# Patient Record
Sex: Female | Born: 1980
Health system: Southern US, Community
[De-identification: ages and names within clinical notes are randomized; demographics above are authoritative.]

## PROBLEM LIST (undated history)

## (undated) DIAGNOSIS — Z8041 Family history of malignant neoplasm of ovary: Secondary | ICD-10-CM

## (undated) DIAGNOSIS — D649 Anemia, unspecified: Secondary | ICD-10-CM

## (undated) DIAGNOSIS — Z1371 Encounter for nonprocreative screening for genetic disease carrier status: Secondary | ICD-10-CM

## (undated) DIAGNOSIS — K219 Gastro-esophageal reflux disease without esophagitis: Secondary | ICD-10-CM

## (undated) DIAGNOSIS — R87612 Low grade squamous intraepithelial lesion on cytologic smear of cervix (LGSIL): Secondary | ICD-10-CM

## (undated) DIAGNOSIS — Z803 Family history of malignant neoplasm of breast: Secondary | ICD-10-CM

## (undated) HISTORY — PX: TONSILLECTOMY: SUR1361

## (undated) HISTORY — DX: Family history of malignant neoplasm of breast: Z80.3

## (undated) HISTORY — DX: Family history of malignant neoplasm of ovary: Z80.41

## (undated) HISTORY — DX: Encounter for nonprocreative screening for genetic disease carrier status: Z13.71

## (undated) HISTORY — DX: Low grade squamous intraepithelial lesion on cytologic smear of cervix (LGSIL): R87.612

---

## 2004-03-25 ENCOUNTER — Inpatient Hospital Stay: Payer: Self-pay | Admitting: Obstetrics & Gynecology

## 2012-12-06 DIAGNOSIS — Z803 Family history of malignant neoplasm of breast: Secondary | ICD-10-CM

## 2012-12-06 DIAGNOSIS — Z1371 Encounter for nonprocreative screening for genetic disease carrier status: Secondary | ICD-10-CM

## 2012-12-06 HISTORY — DX: Family history of malignant neoplasm of breast: Z80.3

## 2012-12-06 HISTORY — DX: Encounter for nonprocreative screening for genetic disease carrier status: Z13.71

## 2014-12-18 ENCOUNTER — Ambulatory Visit (INDEPENDENT_AMBULATORY_CARE_PROVIDER_SITE_OTHER): Payer: 59 | Admitting: Unknown Physician Specialty

## 2014-12-18 ENCOUNTER — Encounter: Payer: Self-pay | Admitting: Unknown Physician Specialty

## 2014-12-18 VITALS — BP 137/68 | HR 103 | Temp 98.4°F | Ht 64.7 in | Wt 141.2 lb

## 2014-12-18 DIAGNOSIS — J309 Allergic rhinitis, unspecified: Secondary | ICD-10-CM

## 2014-12-18 MED ORDER — FLUTICASONE PROPIONATE 50 MCG/ACT NA SUSP: 2.0000 | Freq: Every day | NASAL | Status: DC

## 2014-12-18 NOTE — Assessment & Plan Note (Signed)
Rx for Flonase. 

## 2014-12-18 NOTE — Progress Notes (Signed)
   BP 137/68 mmHg  Pulse 103  Temp(Src) 98.4 F (36.9 C)  Ht 5' 4.7" (1.643 m)  Wt 141 lb 3.2 oz (64.048 kg)  BMI 23.73 kg/m2  SpO2 100%  LMP 12/04/2014 (Approximate)   Subjective:    Patient ID: Brittney Knox, female    DOB: 04/22/1980, 34 y.o.   MRN: [id number]  HPI: Brittney Knox is a 34 y.o. female  Chief Complaint  Patient presents with  . Establish Care   Pt here to estabilish care.  She is on no medications.    History significant for frequent sinus infections.  Gyn manages physical and health maintenance labs Relevant past medical, surgical, family and social history reviewed and updated as indicated. Interim medical history since our last visit reviewed. Allergies and medications reviewed and updated.  Review of Systems  Constitutional: Negative.   HENT: Negative.   Eyes: Negative.   Respiratory: Negative.   Cardiovascular: Negative.   Gastrointestinal: Negative.   Endocrine: Negative.   Genitourinary: Negative.   Musculoskeletal:       Some back pain.  She does yoga which helps.    Skin: Negative.   Allergic/Immunologic: Negative.        Allergic rhinitis.  Has allergies and gets a sinus infection a couple of times of years.    Neurological: Negative.   Hematological: Negative.   Psychiatric/Behavioral: Negative.     Per HPI unless specifically indicated above     Objective:    BP 137/68 mmHg  Pulse 103  Temp(Src) 98.4 F (36.9 C)  Ht 5' 4.7" (1.643 m)  Wt 141 lb 3.2 oz (64.048 kg)  BMI 23.73 kg/m2  SpO2 100%  LMP 12/04/2014 (Approximate)  Wt Readings from Last 3 Encounters:  12/18/14 141 lb 3.2 oz (64.048 kg)    Physical Exam  Constitutional: She is oriented to person, place, and time. She appears well-developed and well-nourished. No distress.  HENT:  Head: Normocephalic and atraumatic.  Eyes: Conjunctivae and lids are normal. Right eye exhibits no discharge. Left eye exhibits no discharge. No scleral icterus.  Neck: Normal range of  motion. Neck supple. No JVD present. Carotid bruit is not present.  Cardiovascular: Normal rate, regular rhythm and normal heart sounds.   Pulmonary/Chest: Effort normal and breath sounds normal.  Abdominal: Normal appearance. There is no splenomegaly or hepatomegaly.  Musculoskeletal: Normal range of motion.  Neurological: She is alert and oriented to person, place, and time.  Skin: Skin is warm, dry and intact. No rash noted. No pallor.  Psychiatric: She has a normal mood and affect. Her behavior is normal. Judgment and thought content normal.  Nursing note reviewed.   No results found for this or any previous visit.    Assessment & Plan:   Problem List Items Addressed This Visit      Unprioritized   Allergic rhinitis - Primary    Rx for Flonase          Follow up plan: Return if symptoms worsen or fail to improve.

## 2015-06-22 ENCOUNTER — Encounter: Payer: Self-pay | Admitting: Unknown Physician Specialty

## 2015-06-22 ENCOUNTER — Ambulatory Visit
Admission: RE | Admit: 2015-06-22 | Discharge: 2015-06-22 | Disposition: A | Discharge status: Home / Self Care | Payer: Self-pay | Source: Ambulatory Visit | Attending: Unknown Physician Specialty | Admitting: Unknown Physician Specialty

## 2015-06-22 ENCOUNTER — Ambulatory Visit (INDEPENDENT_AMBULATORY_CARE_PROVIDER_SITE_OTHER): Payer: 59 | Admitting: Unknown Physician Specialty

## 2015-06-22 VITALS — BP 119/74 | HR 78 | Temp 98.0°F | Ht 65.0 in | Wt 140.8 lb

## 2015-06-22 DIAGNOSIS — M545 Low back pain: Secondary | ICD-10-CM

## 2015-06-22 DIAGNOSIS — M5440 Lumbago with sciatica, unspecified side: Secondary | ICD-10-CM | POA: Insufficient documentation

## 2015-06-22 LAB — UA/M W/RFLX CULTURE, ROUTINE
BILIRUBIN UA: NEGATIVE
Glucose, UA: NEGATIVE
KETONES UA: NEGATIVE
Leukocytes, UA: NEGATIVE
NITRITE UA: NEGATIVE
Protein, UA: NEGATIVE
SPEC GRAV UA: 1.015 (ref 1.005–1.030)
Urobilinogen, Ur: 0.2 mg/dL (ref 0.2–1.0)
pH, UA: 7 (ref 5.0–7.5)

## 2015-06-22 LAB — MICROSCOPIC EXAMINATION

## 2015-06-22 MED ORDER — NAPROXEN 500 MG PO TABS: 500.0000 mg | ORAL_TABLET | Freq: Two times a day (BID) | ORAL | Status: DC

## 2015-06-22 NOTE — Progress Notes (Signed)
   BP 119/74 mmHg  Pulse 78  Temp(Src) 98 F (36.7 C)  Ht 5\' 5"  (1.651 m)  Wt 140 lb 12.8 oz (63.866 kg)  BMI 23.43 kg/m2  SpO2 100%  LMP 06/17/2015 (Exact Date)   Subjective:    Patient ID: Brittney Knox, female    DOB: 10/30/1980, 35 y.o.   MRN: [id number]  HPI: Brittney Knox is a 35 y.o. female  Chief Complaint  Patient presents with  . Back Pain    pt states she has been having pain in lower back in the middle, states it has been bothering her for a while   Pt  Having achy and dull low back for a period of months.  Pt has tried a lot of different things such as yoga, stretching, heating pad, and sitting on pillows.  States the pain is constant and worse with sitting down after even mild activity.  States she came in today as her legs were feeling weak in the last month.  No problems with bowel or bladder.  No numbness or tingling.  No fever  Mother has DDD and RA  Relevant past medical, surgical, family and social history reviewed and updated as indicated. Interim medical history since our last visit reviewed. Allergies and medications reviewed and updated.  Review of Systems  Constitutional: Negative.   HENT: Negative.   Musculoskeletal: Positive for back pain.  Psychiatric/Behavioral: Negative.     Per HPI unless specifically indicated above     Objective:    BP 119/74 mmHg  Pulse 78  Temp(Src) 98 F (36.7 C)  Ht 5\' 5"  (1.651 m)  Wt 140 lb 12.8 oz (63.866 kg)  BMI 23.43 kg/m2  SpO2 100%  LMP 06/17/2015 (Exact Date)  Wt Readings from Last 3 Encounters:  06/22/15 140 lb 12.8 oz (63.866 kg)  12/18/14 141 lb 3.2 oz (64.048 kg)    Physical Exam  Constitutional: She is oriented to person, place, and time. She appears well-developed and well-nourished. No distress.  HENT:  Head: Normocephalic and atraumatic.  Eyes: Conjunctivae and lids are normal. Right eye exhibits no discharge. Left eye exhibits no discharge. No scleral icterus.  Cardiovascular: Normal  rate.   Pulmonary/Chest: Effort normal.  Abdominal: Normal appearance. There is no splenomegaly or hepatomegaly.  Musculoskeletal:       Lumbar back: She exhibits decreased range of motion and pain. She exhibits no swelling and no edema.  SLRs and DTRs are normal.  Hurts with bending forward.  Otherwise FROM.     Neurological: She is alert and oriented to person, place, and time.  Skin: Skin is intact. No rash noted. No pallor.  Psychiatric: She has a normal mood and affect. Her behavior is normal. Judgment and thought content normal.    No results found for this or any previous visit.    Assessment & Plan:   Problem List Items Addressed This Visit    None    Visit Diagnoses    Midline low back pain, with sciatica presence unspecified    -  Primary    Relevant Medications    naproxen (NAPROSYN) 500 MG tablet    Other Relevant Orders    UA/M w/rflx Culture, Routine    DG Lumbar Spine Complete    Ambulatory referral to Physical Therapy        Follow up plan: Return in about 4 weeks (around 07/20/2015).

## 2015-06-27 ENCOUNTER — Ambulatory Visit
Admission: EM | Admit: 2015-06-27 | Discharge: 2015-06-27 | Disposition: A | Discharge status: Home / Self Care | Payer: 59 | Attending: Family Medicine | Admitting: Family Medicine

## 2015-06-27 ENCOUNTER — Other Ambulatory Visit: Payer: Self-pay

## 2015-06-27 DIAGNOSIS — R002 Palpitations: Secondary | ICD-10-CM

## 2015-06-27 DIAGNOSIS — D649 Anemia, unspecified: Secondary | ICD-10-CM

## 2015-06-27 LAB — URINALYSIS COMPLETE WITH MICROSCOPIC (ARMC ONLY)
Bilirubin Urine: NEGATIVE
Glucose, UA: NEGATIVE mg/dL
Hgb urine dipstick: NEGATIVE
Ketones, ur: NEGATIVE mg/dL
Leukocytes, UA: NEGATIVE
Nitrite: NEGATIVE
Protein, ur: NEGATIVE mg/dL
RBC / HPF: NONE SEEN RBC/hpf (ref 0–5)
Specific Gravity, Urine: 1.015 (ref 1.005–1.030)
Squamous Epithelial / LPF: NONE SEEN
pH: 8.5 — ABNORMAL HIGH (ref 5.0–8.0)

## 2015-06-27 LAB — CBC WITH DIFFERENTIAL/PLATELET
Basophils Absolute: 0.1 10*3/uL (ref 0–0.1)
Basophils Relative: 1 %
Eosinophils Absolute: 0.1 10*3/uL (ref 0–0.7)
Eosinophils Relative: 2 %
HCT: 33.2 % — ABNORMAL LOW (ref 35.0–47.0)
Hemoglobin: 10.7 g/dL — ABNORMAL LOW (ref 12.0–16.0)
Lymphocytes Relative: 23 %
Lymphs Abs: 1.3 10*3/uL (ref 1.0–3.6)
MCH: 26.9 pg (ref 26.0–34.0)
MCHC: 32.2 g/dL (ref 32.0–36.0)
MCV: 83.3 fL (ref 80.0–100.0)
Monocytes Absolute: 0.4 10*3/uL (ref 0.2–0.9)
Monocytes Relative: 8 %
Neutro Abs: 3.7 10*3/uL (ref 1.4–6.5)
Neutrophils Relative %: 66 %
Platelets: 123 10*3/uL — ABNORMAL LOW (ref 150–440)
RBC: 3.98 MIL/uL (ref 3.80–5.20)
RDW: 16.4 % — ABNORMAL HIGH (ref 11.5–14.5)
WBC: 5.6 10*3/uL (ref 3.6–11.0)

## 2015-06-27 LAB — BASIC METABOLIC PANEL
Anion gap: 7 (ref 5–15)
BUN: 9 mg/dL (ref 6–20)
CO2: 27 mmol/L (ref 22–32)
Calcium: 8.9 mg/dL (ref 8.9–10.3)
Chloride: 104 mmol/L (ref 101–111)
Creatinine, Ser: 0.53 mg/dL (ref 0.44–1.00)
GFR calc Af Amer: >60 mL/min (ref >60)
GLUCOSE: 99 mg/dL (ref 65–99)
Potassium: 3.8 mmol/L (ref 3.5–5.1)
SODIUM: 138 mmol/L (ref 135–145)

## 2015-06-27 LAB — PREGNANCY, URINE: Preg Test, Ur: NEGATIVE

## 2015-06-27 LAB — MAGNESIUM: Magnesium: 2.1 mg/dL (ref 1.7–2.4)

## 2015-06-27 LAB — TSH: TSH: 2.284 u[IU]/mL (ref 0.350–4.500)

## 2015-06-27 NOTE — ED Provider Notes (Signed)
[facility]  ____________________________________________  Time seen: Approximately 5:04 PM  I have reviewed the triage vital signs and the nursing notes.   HISTORY  Chief Complaint Tachycardia   HPI Brittney Knox is a 35 y.o. female patient present for the complaint of intermittent palpitations that has been present throughout the day today. Patient reports that onset was this morning. Denies known triggers. Patient reports that she does have a history of similar in past associated with her menstrual cycles. Patient states in the past for the last several years she may have a few episodes of the same sensation and palpitation feeling a day or two before her menstrual. Patient reports she just completed her most recent menstrual cycle this past Friday. Patient reports that she works from home, and was at work today during these episodes. Denies any associated symptoms with these episodes. Patient states other than noticing the intermittent palpitations she feels well and denies other symptoms. Denies current feelings of palpitations. Reports continues to eat and drink well.   Denies chest pain, shortness breath, chest pain with deep breath, dizziness, weakness, vision changes, extremity pain, extremity swelling, lightheaded feeling, abdominal pain, dysuria, fevers, recent sickness. Denies recent immobilizations. Denies recent surgeries. Denies frequent caffeine use.    Patient states that she occasionally has 1 caffeinated beverage per day, denies any changes. Denies cough, congestions, fevers, recent sickness. Denies any changes in stress or anxiety and denies stress regarding this being a contributing factor. Patient states that she rarely drinks alcohol. Denies any drugs. Denies smoking. No exogenous estrogen. No oral contraceptives. Denies any over the counter or prescription medications. Denies any abnormal bleeding. Patient reports she does intermittently have heavy menstrual  cycles but states this last cycle was considered normal. Denies any blood in urine or blood in stool, dark-colored stool or blood with wiping. Reports multiple female family members with hypothyroidism.  PCP: Jamesetta Orleans   Patient's last menstrual period was 06/19/2015. Patient reports husband had vasectomy 10 years ago. Denies chance of pregnancy.   History reviewed. No pertinent past medical history.  Patient Active Problem List   Diagnosis Date Noted  . Allergic rhinitis 12/18/2014  Anemia postpartum per patient. Gravida 2 para 2-youngest child 74 years old. No history of transfusions.   History reviewed. No pertinent past surgical history.  Current Outpatient Rx  Name  Route  Sig  Dispense  Refill  . none            Allergies Review of patient's allergies indicates no known allergies.  Family History  Problem Relation Age of Onset  . Arthritis Mother     RA  . Heart disease Mother   . Hypothyroidism Mother   . Heart disease Father     A fib  . Diabetes Father   . Hypothyroidism Sister     Social History Social History  Substance Use Topics  . Smoking status: Never Smoker   . Smokeless tobacco: Never Used  . Alcohol Use: No    Review of Systems Constitutional: No fever/chills Eyes: No visual changes. ENT: No sore throat. Cardiovascular: Denies chest pain. As above.  Respiratory: Denies shortness of breath. Gastrointestinal: No abdominal pain.  No nausea, no vomiting.  No diarrhea.  No constipation. Genitourinary: Negative for dysuria. Musculoskeletal: Negative for back pain. Skin: Negative for rash. Neurological: Negative for headaches, focal weakness or numbness.  10-point ROS otherwise negative.  ____________________________________________ Ceasar Mons Vitals:   06/27/15 1530 06/27/15 1625  BP: 119/67 85/62  Pulse: 93 78  Temp: 98 F (36.7 C) 98 F (36.7 C)  Resp: 16 16     Orthostatic VS for the past 24 hrs:  BP- Lying Pulse- Lying BP- Sitting Pulse-  Sitting BP- Standing at 0 minutes Pulse- Standing at 0 minutes  06/27/15 1652 106/56 mmHg 73 109/64 mmHg 71 106/69 mmHg 79      Constitutional: Alert and oriented. Well appearing and in no acute distress. Eyes: Conjunctivae are normal. PERRL. EOMI. Head: Atraumatic.  Ears: no erythema, normal TMs bilaterally.   Nose: No congestion/rhinnorhea.  Mouth/Throat: Mucous membranes are moist.  Oropharynx non-erythematous. Neck: No stridor.  No cervical spine tenderness to palpation. Hematological/Lymphatic/Immunilogical: No cervical lymphadenopathy. Cardiovascular: Normal rate, regular rhythm, and sitting and standing positions.. No murmurs rubs or gallops. Grossly normal heart sounds.  Good peripheral circulation. Respiratory: Normal respiratory effort.  No retractions. Lungs CTAB. No wheezes, rales or rhonchi. Speaks in complete sentences.  Gastrointestinal: Soft and nontender. No distention. Normal Bowel sounds. No CVA tenderness. Musculoskeletal: No lower or upper extremity tenderness nor edema.  No calf tenderness bilaterally. Bilateral pedal pulses equal and easily palpated. Steady gait.  Neurologic:  Normal speech and language. No gross focal neurologic deficits are appreciated. No gait instability. Skin:  Skin is warm, dry and intact. No rash noted. Psychiatric: Mood and affect are normal. Speech and behavior are normal.  ____________________________________________   LABS (all labs ordered are listed, but only abnormal results are displayed)  Labs Reviewed  CBC WITH DIFFERENTIAL/PLATELET - Abnormal; Notable for the following:    Hemoglobin 10.7 (*)    HCT 33.2 (*)    RDW 16.4 (*)    Platelets 123 (*)    All other components within normal limits  URINALYSIS COMPLETEWITH MICROSCOPIC ([facility] ONLY) - Abnormal; Notable for the following:    Color, Urine STRAW (*)    pH 8.5 (*)    Bacteria, UA RARE (*)    All other components within normal limits  URINE CULTURE  BASIC METABOLIC  PANEL  MAGNESIUM  PREGNANCY, URINE  TSH   ____________________________________________  EKG  ED ECG REPORT I, Renford Dills, the attending provider and Dr. Judd Gaudier, personally viewed and interpreted this ECG.   Date: 06/27/2015  EKG Time: 1529  Rate: 93  Rhythm: normal EKG, normal sinus rhythm,   Axis: normal  Intervals:RSR or QR pattern in V1 suggests right ventricular conduction delay  ST&T Change: none  ____________________________________________  RADIOLOGY  No results found.   INITIAL IMPRESSION / ASSESSMENT AND PLAN / ED COURSE  Pertinent labs & imaging results that were available during my care of the patient were reviewed by me and considered in my medical decision making (see chart for details).  Very well-appearing patient. No acute distress. Presenting for complaints of intermittent palpitations today. Denies continued being factors. Reports no other complaint complaints. Denies any chest pain, shortness breath, dizziness, weakness or other complaints. Reports continues to eat and drink well. Denies current palpitation feelings. Denies known injury in factors. Will evaluate CBC, BMP, mag, TSH and urinalysis and urine pregnancy.  Labs reviewed. Urinalysis rare bacteria however patient denies dysuria, suspect contamination, will culture urinalysis and patient aware of this. Labs reviewed. Labs unremarkable except for anemia and lower platelet count of 123. Discussed these findings with patient. Patient reports an history she was taking multivitamin with iron due to her anemia after her second child. Discussed with patient the importance of restarting vitamin with iron and to have close follow-up of this, including hemoglobin and platelet counts. Discussed  patient will call patient when TSH lab returns. Patient is asymptomatic at this time. Encouraged rest, water intake, eating healthy diet and close follow-up with her PCP. Patient states that she has a primary care  appointment on Tuesday but encourage follow-up in 2-3 days. Discussed in detail with patient if any symptoms changes, chest pain, shortness of breath, dizziness or other symptoms to be evaluated immediately at urgent care or ER.  Discussed follow up with Primary care physician this week. Discussed follow up and return parameters including no resolution or any worsening concerns. Patient verbalized understanding and agreed to plan.   ____________________________________________   FINAL CLINICAL IMPRESSION(S) / ED DIAGNOSES  Final diagnoses:  Intermittent palpitations  Anemia, unspecified anemia type     Discharge Medication List as of 06/27/2015  5:43 PM      Note: This dictation was prepared with Dragon dictation along with smaller phrase technology. Any transcriptional errors that result from this process are unintentional.       Renford DillsLindsey Ester Mabe, NP 06/27/15 1910

## 2015-06-27 NOTE — Discharge Instructions (Signed)
Take medication as prescribed. Rest. Drink plenty of fluids. Take multivitamin with iron daily.   Follow up with your primary care physician in 2-3 days. Return to Urgent care or ER for palpitations, chest pain, shortness of breath, dizziness, weakness, new or worsening concerns.    Palpitations A palpitation is the feeling that your heartbeat is irregular or is faster than normal. It may feel like your heart is fluttering or skipping a beat. Palpitations are usually not a serious problem. However, in some cases, you may need further medical evaluation. CAUSES  Palpitations can be caused by:  Smoking.  Caffeine or other stimulants, such as diet pills or energy drinks.  Alcohol.  Stress and anxiety.  Strenuous physical activity.  Fatigue.  Certain medicines.  Heart disease, especially if you have a history of irregular heart rhythms (arrhythmias), such as atrial fibrillation, atrial flutter, or supraventricular tachycardia.  An improperly working pacemaker or defibrillator. DIAGNOSIS  To find the cause of your palpitations, your health care provider will take your medical history and perform a physical exam. Your health care provider may also have you take a test called an ambulatory electrocardiogram (ECG). An ECG records your heartbeat patterns over a 24-hour period. You may also have other tests, such as:  Transthoracic echocardiogram (TTE). During echocardiography, sound waves are used to evaluate how blood flows through your heart.  Transesophageal echocardiogram (TEE).  Cardiac monitoring. This allows your health care provider to monitor your heart rate and rhythm in real time.  Holter monitor. This is a portable device that records your heartbeat and can help diagnose heart arrhythmias. It allows your health care provider to track your heart activity for several days, if needed.  Stress tests by exercise or by giving medicine that makes the heart beat faster. TREATMENT    Treatment of palpitations depends on the cause of your symptoms and can vary greatly. Most cases of palpitations do not require any treatment other than time, relaxation, and monitoring your symptoms. Other causes, such as atrial fibrillation, atrial flutter, or supraventricular tachycardia, usually require further treatment. HOME CARE INSTRUCTIONS   Avoid:  Caffeinated coffee, tea, soft drinks, diet pills, and energy drinks.  Chocolate.  Alcohol.  Stop smoking if you smoke.  Reduce your stress and anxiety. Things that can help you relax include:  A method of controlling things in your body, such as your heartbeats, with your mind (biofeedback).  Yoga.  Meditation.  Physical activity such as swimming, jogging, or walking.  Get plenty of rest and sleep. SEEK MEDICAL CARE IF:   You continue to have a fast or irregular heartbeat beyond 24 hours.  Your palpitations occur more often. SEEK IMMEDIATE MEDICAL CARE IF:  You have chest pain or shortness of breath.  You have a severe headache.  You feel dizzy or you faint. MAKE SURE YOU:  Understand these instructions.  Will watch your condition.  Will get help right away if you are not doing well or get worse.   This information is not intended to replace advice given to you by your health care provider. Make sure you discuss any questions you have with your health care provider.   Document Released: 12/21/1999 Document Revised: 12/28/2012 Document Reviewed: 02/21/2011 Elsevier Interactive Patient Education [employer] Inc.

## 2015-06-27 NOTE — ED Notes (Signed)
Patient complains of rapid heart rate, states that it comes and goes. She said it feels like its skipping a beat. It has been happening for a couple days. Denies excessive caffeine use.

## 2015-06-29 LAB — URINE CULTURE: Special Requests: NORMAL

## 2015-07-03 ENCOUNTER — Ambulatory Visit: Payer: 59 | Admitting: Unknown Physician Specialty

## 2015-07-20 ENCOUNTER — Ambulatory Visit: Payer: 59 | Admitting: Unknown Physician Specialty

## 2016-05-19 ENCOUNTER — Ambulatory Visit: Payer: Self-pay | Admitting: Obstetrics & Gynecology

## 2016-09-09 ENCOUNTER — Ambulatory Visit: Payer: Self-pay | Admitting: Unknown Physician Specialty

## 2016-09-16 ENCOUNTER — Ambulatory Visit: Payer: Self-pay | Admitting: Obstetrics & Gynecology

## 2017-01-16 ENCOUNTER — Ambulatory Visit: Payer: Self-pay | Admitting: Obstetrics and Gynecology

## 2017-03-12 ENCOUNTER — Ambulatory Visit (INDEPENDENT_AMBULATORY_CARE_PROVIDER_SITE_OTHER): Payer: 59 | Admitting: Family Medicine

## 2017-03-12 VITALS — BP 115/74 | HR 80 | Temp 97.5°F | Wt 142.9 lb

## 2017-03-12 DIAGNOSIS — R2242 Localized swelling, mass and lump, left lower limb: Secondary | ICD-10-CM

## 2017-03-12 NOTE — Progress Notes (Signed)
BP 115/74 (BP Location: Left Arm, Patient Position: Sitting)   Pulse 80   Temp (!) 97.5 F (36.4 C) (Oral)   Wt 142 lb 14.4 oz (64.8 kg)   BMI 23.78 kg/m    Subjective:    Patient ID: Brittney Knox, female    DOB: Nov 30, 1980, 37 y.o.   MRN: [id number]  HPI: Brittney Knox is a 37 y.o. female  Chief Complaint  Patient presents with  . Leg Pain    left leg inner thigh knot x 2 months - painful    Pt here for eval of a mass of left inner thigh that she first started noticing about 2 months ago. Has always had some discolored red dots in that area since she was a child, but now feeling a palpable lump underneath. No known injury, no recent illnesses, fevers, body aches, weight loss. Has not noticed any growth over the past 2 months.   Relevant past medical, surgical, family and social history reviewed and updated as indicated. Interim medical history since our last visit reviewed. Allergies and medications reviewed and updated.  Review of Systems  Per HPI unless specifically indicated above     Objective:    BP 115/74 (BP Location: Left Arm, Patient Position: Sitting)   Pulse 80   Temp (!) 97.5 F (36.4 C) (Oral)   Wt 142 lb 14.4 oz (64.8 kg)   BMI 23.78 kg/m   Wt Readings from Last 3 Encounters:  03/12/17 142 lb 14.4 oz (64.8 kg)  06/27/15 140 lb (63.5 kg)  06/22/15 140 lb 12.8 oz (63.9 kg)    Physical Exam  Constitutional: She is oriented to person, place, and time. She appears well-developed and well-nourished. No distress.  HENT:  Head: Atraumatic.  Eyes: Conjunctivae are normal. Pupils are equal, round, and reactive to light.  Neck: Neck supple.  Cardiovascular: Normal rate and normal heart sounds.  Pulmonary/Chest: Effort normal and breath sounds normal. No respiratory distress.  Musculoskeletal: Normal range of motion.  Lymphadenopathy:    She has no cervical adenopathy.  Neurological: She is alert and oriented to person, place, and time.  Skin: Skin is  warm and dry.  Smooth, 1 cm palpable mass of left medial thigh. Mild ttp.  Som color change over the area which pt states is not new  Psychiatric: She has a normal mood and affect. Her behavior is normal.  Nursing note and vitals reviewed.   Results for orders placed or performed during the hospital encounter of 06/27/15  Urine culture  Result Value Ref Range   Specimen Description URINE, CLEAN CATCH    Special Requests Normal    Culture MULTIPLE SPECIES PRESENT, SUGGEST RECOLLECTION (A)    Report Status 06/29/2015 FINAL   CBC with Differential  Result Value Ref Range   WBC 5.6 3.6 - 11.0 K/uL   RBC 3.98 3.80 - 5.20 MIL/uL   Hemoglobin 10.7 (L) 12.0 - 16.0 g/dL   HCT 40.9 (L) 81.1 - 91.4 %   MCV 83.3 80.0 - 100.0 fL   MCH 26.9 26.0 - 34.0 pg   MCHC 32.2 32.0 - 36.0 g/dL   RDW 78.2 (H) 95.6 - 21.3 %   Platelets 123 (L) 150 - 440 K/uL   Neutrophils Relative % 66 %   Neutro Abs 3.7 1.4 - 6.5 K/uL   Lymphocytes Relative 23 %   Lymphs Abs 1.3 1.0 - 3.6 K/uL   Monocytes Relative 8 %   Monocytes Absolute 0.4 0.2 - 0.9 K/uL  Eosinophils Relative 2 %   Eosinophils Absolute 0.1 0 - 0.7 K/uL   Basophils Relative 1 %   Basophils Absolute 0.1 0 - 0.1 K/uL  Basic metabolic panel  Result Value Ref Range   Sodium 138 135 - 145 mmol/L   Potassium 3.8 3.5 - 5.1 mmol/L   Chloride 104 101 - 111 mmol/L   CO2 27 22 - 32 mmol/L   Glucose, Bld 99 65 - 99 mg/dL   BUN 9 6 - 20 mg/dL   Creatinine, Ser 1.610.53 0.44 - 1.00 mg/dL   Calcium 8.9 8.9 - 09.610.3 mg/dL   GFR calc non Af Amer >60 >60 mL/min   GFR calc Af Amer >60 >60 mL/min   Anion gap 7 5 - 15  Magnesium  Result Value Ref Range   Magnesium 2.1 1.7 - 2.4 mg/dL  Urinalysis complete, with microscopic ([facility] only)  Result Value Ref Range   Color, Urine STRAW (A) YELLOW   APPearance CLEAR CLEAR   Glucose, UA NEGATIVE NEGATIVE mg/dL   Bilirubin Urine NEGATIVE NEGATIVE   Ketones, ur NEGATIVE NEGATIVE mg/dL   Specific Gravity, Urine 1.015  1.005 - 1.030   Hgb urine dipstick NEGATIVE NEGATIVE   pH 8.5 (H) 5.0 - 8.0   Protein, ur NEGATIVE NEGATIVE mg/dL   Nitrite NEGATIVE NEGATIVE   Leukocytes, UA NEGATIVE NEGATIVE   RBC / HPF NONE SEEN 0 - 5 RBC/hpf   WBC, UA 0-5 0 - 5 WBC/hpf   Bacteria, UA RARE (A) NONE SEEN   Squamous Epithelial / LPF NONE SEEN NONE SEEN   Amorphous Crystal PRESENT   Pregnancy, urine  Result Value Ref Range   Preg Test, Ur NEGATIVE NEGATIVE  TSH  Result Value Ref Range   TSH 2.284 0.350 - 4.500 uIU/mL      Assessment & Plan:   Problem List Items Addressed This Visit    None    Visit Diagnoses    Leg mass, left    -  Primary   Will obtain x-ray to determine makeup of mass, does not necessarily feel lipomatous, could be lymph node or cyst. Await results, will proceed depending   Relevant Orders   US LT LOWER EXTREM LTD SOFT TISSUE NON VASCULAR       Follow up plan: Return if symptoms worsen or fail to improve.

## 2017-03-13 ENCOUNTER — Telehealth: Payer: Self-pay | Admitting: Family Medicine

## 2017-03-13 NOTE — Telephone Encounter (Signed)
LVM for patient

## 2017-03-13 NOTE — Telephone Encounter (Signed)
Copied from CRM [id number]. Topic: Inquiry >> Mar 13, 2017  2:30 PM Maia Pettiesrtiz, Kristie S wrote: Reason for CRM: pt states she was to have US on her leg and has not heard from anyone yet. Please advise.

## 2017-03-13 NOTE — Telephone Encounter (Signed)
She should hear something next week, I will keep this message so we can check in on it throughout the week

## 2017-03-15 ENCOUNTER — Encounter: Payer: Self-pay | Admitting: Family Medicine

## 2017-03-15 NOTE — Patient Instructions (Signed)
Follow up as needed

## 2017-03-17 NOTE — Telephone Encounter (Signed)
Ultrasound is scheduled for Wednesday 03/25/2017 at 3:30 [facility] 3:[facility]

## 2017-03-18 NOTE — Telephone Encounter (Signed)
Patient notified but [place] because [place].  Explained to either [facility]. Either or could r/s her appt.

## 2017-03-23 ENCOUNTER — Ambulatory Visit (HOSPITAL_COMMUNITY): Payer: 59

## 2017-03-25 ENCOUNTER — Ambulatory Visit: Payer: 59

## 2017-06-19 ENCOUNTER — Emergency Department: Payer: 59

## 2017-06-19 ENCOUNTER — Encounter: Payer: Self-pay | Admitting: Emergency Medicine

## 2017-06-19 ENCOUNTER — Other Ambulatory Visit: Payer: Self-pay

## 2017-06-19 ENCOUNTER — Emergency Department
Admission: EM | Admit: 2017-06-19 | Discharge: 2017-06-19 | Disposition: A | Discharge status: Home / Self Care | Payer: 59 | Attending: Emergency Medicine | Admitting: Emergency Medicine

## 2017-06-19 DIAGNOSIS — M79652 Pain in left thigh: Secondary | ICD-10-CM

## 2017-06-19 NOTE — ED Triage Notes (Signed)
Patient ambulatory to triage with steady gait, without difficulty or distress noted; pt reports left inner leg pain x 3-324months; st her doctor rec u/s; denies any known injury, denies any accomp symptoms

## 2017-06-19 NOTE — ED Provider Notes (Signed)
[facility] Emergency Department Provider Note  ____________________________________________   First MD Initiated Contact with Patient 06/19/17 [phone]     (approximate)  I have reviewed the triage vital signs and the nursing notes.   HISTORY  Chief Complaint Leg Pain    HPI Brittney Knox is a 37 y.o. female with no contributory or chronic medical history who presents for evaluation of 3 to 4 months of pain in the inner part of her left thigh at one particular spot.  She has a little bit of a lump and sometimes it gets better sometimes it gets worse.  Nothing in particular seems to change the symptoms.  She has no history of trauma.  She has no history of blood clots in her legs or lungs.  The pain does not radiate.  She is not on exogenous estrogen and has not had any recent immobilizations.  She talked her primary care doctor about it who recommended that she had an ultrasound, but she says she has not had time to do so for the last few weeks (or possibly months) so she thought she would come into the emergency department tonight for an ultrasound.  He has no numbness or tingling in her extremities.  History reviewed. No pertinent past medical history.  Patient Active Problem List   Diagnosis Date Noted  . Allergic rhinitis 12/18/2014    History reviewed. No pertinent surgical history.  Prior to Admission medications   Medication Sig Start Date End Date Taking? Authorizing Provider  naproxen (NAPROSYN) 500 MG tablet Take 1 tablet (500 mg total) by mouth 2 (two) times daily with a meal. Patient not taking: Reported on 03/12/2017 06/22/15   Gabriel Cirri, NP    Allergies Patient has no known allergies.  Family History  Problem Relation Age of Onset  . Arthritis Mother        RA  . Heart disease Mother   . Hypothyroidism Mother   . Heart disease Father        A fib  . Diabetes Father   . Hypothyroidism Sister     Social History Social History    Tobacco Use  . Smoking status: Never Smoker  . Smokeless tobacco: Never Used  Substance Use Topics  . Alcohol use: No  . Drug use: No    Review of Systems Constitutional: No fever/chills Cardiovascular: Denies chest pain. Respiratory: Denies shortness of breath. Gastrointestinal: No abdominal pain.   Musculoskeletal: Pain in left thigh as described above.  Negative for neck pain.  Negative for back pain. Integumentary: Negative for rash. Neurological: Negative for headaches, focal weakness or numbness.   ____________________________________________   PHYSICAL EXAM:  VITAL SIGNS: ED Triage Vitals  Enc Vitals Group     BP 06/19/17 0145 114/69     Pulse Rate 06/19/17 0145 (!) 114     Resp 06/19/17 0145 18     Temp 06/19/17 0145 98.2 F (36.8 C)     Temp Source 06/19/17 0145 Oral     SpO2 06/19/17 0145 100 %     Weight 06/19/17 0119 63.5 kg (140 lb)     Height 06/19/17 0119 1.626 m (5\' 4" )     Head Circumference --      Peak Flow --      Pain Score 06/19/17 0119 6     Pain Loc --      Pain Edu? --      Excl. in GC? --     Constitutional: Alert  and oriented. Well appearing and in no acute distress. Nose: No congestion/rhinnorhea. Cardiovascular: Normal rate, regular rhythm. Good peripheral circulation.  Respiratory: Normal respiratory effort.  No retractions.  Gastrointestinal: Soft and nondistended Musculoskeletal: Small palpable cutaneous nodule in the middle of her left medial thigh.  Nontender, appears very slightly ecchymotic as if a bruise.  No fluctuance or induration, no evidence of abscess or infection Neurologic:  Normal speech and language. No gross focal neurologic deficits are appreciated.  Skin:  Skin is warm, dry and intact. No rash noted. Psychiatric: Mood and affect are normal. Speech and behavior are normal.  ____________________________________________   LABS (all labs ordered are listed, but only abnormal results are displayed)  Labs  Reviewed - No data to display ____________________________________________  EKG  None - EKG not ordered by ED physician ____________________________________________  RADIOLOGY   ED MD interpretation: No evidence of DVT  Official radiology report(s): [place] Venous Img Lower Unilateral Left  Result Date: 06/19/2017 CLINICAL DATA:  Left leg pain for 3 or 4 months. EXAM: LEFT LOWER EXTREMITY VENOUS DOPPLER ULTRASOUND TECHNIQUE: Gray-scale sonography with graded compression, as well as color Doppler and duplex ultrasound were performed to evaluate the lower extremity deep venous systems from the level of the common femoral vein and including the common femoral, femoral, profunda femoral, popliteal and calf veins including the posterior tibial, peroneal and gastrocnemius veins when visible. The superficial great saphenous vein was also interrogated. Spectral Doppler was utilized to evaluate flow at rest and with distal augmentation maneuvers in the common femoral, femoral and popliteal veins. COMPARISON:  None. FINDINGS: Contralateral Common Femoral Vein: Respiratory phasicity is normal and symmetric with the symptomatic side. No evidence of thrombus. Normal compressibility. Common Femoral Vein: No evidence of thrombus. Normal compressibility, respiratory phasicity and response to augmentation. Saphenofemoral Junction: No evidence of thrombus. Normal compressibility and flow on color Doppler imaging. Profunda Femoral Vein: No evidence of thrombus. Normal compressibility and flow on color Doppler imaging. Femoral Vein: No evidence of thrombus. Normal compressibility, respiratory phasicity and response to augmentation. Popliteal Vein: No evidence of thrombus. Normal compressibility, respiratory phasicity and response to augmentation. Calf Veins: No evidence of thrombus. Normal compressibility and flow on color Doppler imaging. Superficial Great Saphenous Vein: No evidence of thrombus. Normal compressibility.  Venous Reflux:  None. Other Findings:  None. IMPRESSION: No evidence of left lower extremity deep venous thrombosis. Electronically Signed   By: Rubye OaksMelanie  Ehinger M.D.   On: 06/19/2017 02:33   ____________________________________________   PROCEDURES  Critical Care performed: No   Procedure(s) performed:   Procedures   ____________________________________________   INITIAL IMPRESSION / ASSESSMENT AND PLAN / ED COURSE  As part of my medical decision making, I reviewed the following data within the electronic MEDICAL RECORD NUMBER Nursing notes reviewed and incorporated and Old chart reviewed  No evidence of deep vein thrombosis or other acute or emergent medical condition.  I advised the patient to use cold packs if it is causing more discomfort, over-the-counter pain medication, and follow-up with her primary care provider.  Of note, she left prior to receiving her written discharge instructions.     ____________________________________________  FINAL CLINICAL IMPRESSION(S) / ED DIAGNOSES  Final diagnoses:  Left thigh pain     MEDICATIONS GIVEN DURING THIS VISIT:  Medications - No data to display   ED Discharge Orders    None       Note:  This document was prepared using Dragon voice recognition software and may include unintentional dictation errors.  Loleta Rose, MD 06/19/17 [phone]

## 2017-06-19 NOTE — ED Notes (Signed)
Pt to the er for left leg pain to the inner thigh. Pt states she has not had time to go and get her ultrasound. Pt reports aching to the leg and tenderness to the inner thigh. Denies hx of blood clots.

## 2017-06-19 NOTE — Discharge Instructions (Signed)

## 2017-08-05 ENCOUNTER — Encounter: Payer: Self-pay | Admitting: Obstetrics & Gynecology

## 2017-08-05 ENCOUNTER — Other Ambulatory Visit (HOSPITAL_COMMUNITY)
Admission: RE | Admit: 2017-08-05 | Discharge: 2017-08-05 | Disposition: A | Discharge status: Home / Self Care | Payer: 59 | Source: Ambulatory Visit | Attending: Obstetrics & Gynecology | Admitting: Obstetrics & Gynecology

## 2017-08-05 ENCOUNTER — Ambulatory Visit (INDEPENDENT_AMBULATORY_CARE_PROVIDER_SITE_OTHER): Payer: 59 | Admitting: Obstetrics & Gynecology

## 2017-08-05 VITALS — BP 102/70 | Ht 64.0 in | Wt 144.0 lb

## 2017-08-05 DIAGNOSIS — N920 Excessive and frequent menstruation with regular cycle: Secondary | ICD-10-CM

## 2017-08-05 DIAGNOSIS — Z124 Encounter for screening for malignant neoplasm of cervix: Secondary | ICD-10-CM | POA: Diagnosis present

## 2017-08-05 DIAGNOSIS — Z01411 Encounter for gynecological examination (general) (routine) with abnormal findings: Secondary | ICD-10-CM | POA: Diagnosis not present

## 2017-08-05 DIAGNOSIS — Z8679 Personal history of other diseases of the circulatory system: Secondary | ICD-10-CM

## 2017-08-05 DIAGNOSIS — Z0000 Encounter for general adult medical examination without abnormal findings: Secondary | ICD-10-CM

## 2017-08-05 NOTE — Patient Instructions (Signed)
Endometrial Ablation Endometrial ablation is a procedure that destroys the thin inner layer of the lining of the uterus (endometrium). This procedure may be done:  To stop heavy periods.  To stop bleeding that is causing anemia.  To control irregular bleeding.  To treat bleeding caused by small tumors (fibroids) in the endometrium.  This procedure is often an alternative to major surgery, such as removal of the uterus and cervix (hysterectomy). As a result of this procedure:  You may not be able to have children. However, if you are premenopausal (you have not gone through menopause): ? You may still have a small chance of getting pregnant. ? You will need to use a reliable method of birth control after the procedure to prevent pregnancy.  You may stop having a menstrual period, or you may have only a small amount of bleeding during your period. Menstruation may return several years after the procedure.  Tell a health care provider about:  Any allergies you have.  All medicines you are taking, including vitamins, herbs, eye drops, creams, and over-the-counter medicines.  Any problems you or family members have had with the use of anesthetic medicines.  Any blood disorders you have.  Any surgeries you have had.  Any medical conditions you have. What are the risks? Generally, this is a safe procedure. However, problems may occur, including:  A hole (perforation) in the uterus or bowel.  Infection of the uterus, bladder, or vagina.  Bleeding.  Damage to other structures or organs.  An air bubble in the lung (air embolus).  Problems with pregnancy after the procedure.  Failure of the procedure.  Decreased ability to diagnose cancer in the endometrium.  What happens before the procedure?  You will have tests of your endometrium to make sure there are no pre-cancerous cells or cancer cells present.  You may have an ultrasound of the uterus.  You may be given  medicines to thin the endometrium.  Ask your health care provider about: ? Changing or stopping your regular medicines. This is especially important if you take diabetes medicines or blood thinners. ? Taking medicines such as aspirin and ibuprofen. These medicines can thin your blood. Do not take these medicines before your procedure if your doctor tells you not to.  Plan to have someone take you home from the hospital or clinic. What happens during the procedure?  You will lie on an exam table with your feet and legs supported as in a pelvic exam.  To lower your risk of infection: ? Your health care team will wash or sanitize their hands and put on germ-free (sterile) gloves. ? Your genital area will be washed with soap.  An IV tube will be inserted into one of your veins.  You will be given a medicine to help you relax (sedative).  A surgical instrument with a light and camera (resectoscope) will be inserted into your vagina and moved into your uterus. This allows your surgeon to see inside your uterus.  Endometrial tissue will be removed using one of the following methods: ? Radiofrequency. This method uses a radiofrequency-alternating electric current to remove the endometrium. ? Cryotherapy. This method uses extreme cold to freeze the endometrium. ? Heated-free liquid. This method uses a heated saltwater (saline) solution to remove the endometrium. ? Microwave. This method uses high-energy microwaves to heat up the endometrium and remove it. ? Thermal balloon. This method involves inserting a catheter with a balloon tip into the uterus. The balloon tip is   filled with heated fluid to remove the endometrium. The procedure may vary among health care providers and hospitals. What happens after the procedure?  Your blood pressure, heart rate, breathing rate, and blood oxygen level will be monitored until the medicines you were given have worn off.  As tissue healing occurs, you may  notice vaginal bleeding for 4-6 weeks after the procedure. You may also experience: ? Cramps. ? Thin, watery vaginal discharge that is light pink or brown in color. ? A need to urinate more frequently than usual. ? Nausea.  Do not drive for 24 hours if you were given a sedative.  Do not have sex or insert anything into your vagina until your health care provider approves. Summary  Endometrial ablation is done to treat the many causes of heavy menstrual bleeding.  The procedure may be done only after medications have been tried to control the bleeding.  Plan to have someone take you home from the hospital or clinic. This information is not intended to replace advice given to you by your health care provider. Make sure you discuss any questions you have with your health care provider. Document Released: 11/02/2003 Document Revised: 01/10/2016 Document Reviewed: 01/10/2016 Elsevier Interactive Patient Education  2017 [employer]  

## 2017-08-05 NOTE — Addendum Note (Signed)
Addended by: Nadara MustardHARRIS, Hien Cunliffe P on: 08/05/2017 03:07 PM   Modules accepted: Orders

## 2017-08-05 NOTE — Progress Notes (Signed)
HPI:      [name] is a 37 y.o. [id number] who LMP was Patient's last menstrual period was 07/22/2017., she presents today for her annual examination. The patient has no complaints today. The patient is sexually active. Her last pap: approximate date 2016 and was normal. The patient does perform self breast exams.  There is notable family history of breast or ovarian cancer in her family.  The patient has regular exercise: yes.  The patient denies current symptoms of depression.    GYN History: Contraception: vasectomy  PMHx: Past Medical History:  Diagnosis Date  . LGSIL on Pap smear of cervix    Past Surgical History:  Procedure Laterality Date  . CESAREAN SECTION     Family History  Problem Relation Age of Onset  . Arthritis Mother        RA  . Heart disease Mother   . Hypothyroidism Mother   . Heart disease Father        A fib  . Diabetes Father   . Hypothyroidism Sister   . Breast cancer Maternal Grandmother   . Diabetes Maternal Grandfather   . Ovarian cancer Paternal Grandmother   . Breast cancer Cousin   . Breast cancer Maternal Aunt    Social History   Tobacco Use  . Smoking status: Never Smoker  . Smokeless tobacco: Never Used  Substance Use Topics  . Alcohol use: No  . Drug use: No    Current Outpatient Medications:  .  naproxen (NAPROSYN) 500 MG tablet, Take 1 tablet (500 mg total) by mouth 2 (two) times daily with a meal. (Patient not taking: Reported on 03/12/2017), Disp: 20 tablet, Rfl: 1 Allergies: Patient has no known allergies.  Review of Systems  Constitutional: Negative for chills, fever and malaise/fatigue.  HENT: Negative for congestion, sinus pain and sore throat.   Eyes: Negative for blurred vision and pain.  Respiratory: Negative for cough and wheezing.   Cardiovascular: Negative for chest pain and leg swelling.  Gastrointestinal: Negative for abdominal pain, constipation, diarrhea, heartburn, nausea and vomiting.  Genitourinary:  Negative for dysuria, frequency, hematuria and urgency.  Musculoskeletal: Negative for back pain, joint pain, myalgias and neck pain.  Skin: Negative for itching and rash.  Neurological: Negative for dizziness, tremors and weakness.  Endo/Heme/Allergies: Does not bruise/bleed easily.  Psychiatric/Behavioral: Negative for depression. The patient is not nervous/anxious and does not have insomnia.     Objective: BP 102/70   Ht _0  (1.626 m)   Wt 144 lb (65.3 kg)   LMP 07/22/2017   BMI 24.72 kg/m   Filed Weights   08/05/17 1423  Weight: 144 lb (65.3 kg)   Body mass index is 24.72 kg/m. Physical Exam  Constitutional: She is oriented to person, place, and time. She appears well-developed and well-nourished. No distress.  Genitourinary: Rectum normal, vagina normal and uterus normal. Pelvic exam was performed with patient supine. There is no rash or lesion on the right labia. There is no rash or lesion on the left labia. Vagina exhibits no lesion. No bleeding in the vagina. Right adnexum does not display mass and does not display tenderness. Left adnexum does not display mass and does not display tenderness. Cervix does not exhibit motion tenderness, lesion, friability or polyp.   Uterus is mobile and midaxial. Uterus is not enlarged or exhibiting a mass.  HENT:  Head: Normocephalic and atraumatic. Head is without laceration.  Right Ear: Hearing normal.  Left Ear: Hearing normal.  Nose:  No epistaxis.  No foreign bodies.  Mouth/Throat: Uvula is midline, oropharynx is clear and moist and mucous membranes are normal.  Eyes: Pupils are equal, round, and reactive to light.  Neck: Normal range of motion. Neck supple. No thyromegaly present.  Cardiovascular: Normal rate and regular rhythm. Exam reveals no gallop and no friction rub.  No murmur heard. Pulmonary/Chest: Effort normal and breath sounds normal. No respiratory distress. She has no wheezes. Right breast exhibits no mass, no skin  change and no tenderness. Left breast exhibits no mass, no skin change and no tenderness.  Abdominal: Soft. Bowel sounds are normal. She exhibits no distension. There is no tenderness. There is no rebound.  Musculoskeletal: Normal range of motion.  Neurological: She is alert and oriented to person, place, and time. No cranial nerve deficit.  Skin: Skin is warm and dry.  Psychiatric: She has a normal mood and affect. Judgment normal.  Vitals reviewed.   Assessment:  ANNUAL EXAM 1. Annual physical exam   2. Screening for cervical cancer   3. H/O varicose veins of lower extremity   4. Menorrhagia with regular cycle    Screening Plan:            1.  Cervical Screening-  Pap smear done today  2. Breast screening- Exam annually and mammogram>40 planned   3. BRCA Neg done 2014.  May benefit from update testing. Discussed and to consider.  4. Labs managed by PCP  5. Counseling for contraception: vasectomy   6. Menorrhagia- [place] and assess Patient has abnormal uterine bleeding . She has a normal exam today, with no evidence of lesions.  Evaluation includes the following: exam, labs such as hormonal testing, and pelvic ultrasound to evaluate for any structural gynecologic abnormalities.  Patient to follow up after testing.  Treatment option for menorrhagia or menometrorrhagia discussed in great detail with the patient.  Options include hormonal therapy, IUD therapy such as Mirena, D&C, Ablation, and Hysterectomy.  The pros and cons of each option discussed with patient.  7. Lower extremity vein/ pain  Vascular surgery consult    F/U  Return in about 1 year (around 08/06/2018) for Annual.  Barnett Applebaum, MD, [facility], [facility] 08/05/2017  3:02 PM

## 2017-08-06 ENCOUNTER — Encounter: Payer: Self-pay | Admitting: Physician Assistant

## 2017-08-06 ENCOUNTER — Ambulatory Visit (INDEPENDENT_AMBULATORY_CARE_PROVIDER_SITE_OTHER): Payer: 59 | Admitting: Physician Assistant

## 2017-08-06 VITALS — BP 97/62 | HR 86 | Ht 65.5 in | Wt 144.0 lb

## 2017-08-06 DIAGNOSIS — Z862 Personal history of diseases of the blood and blood-forming organs and certain disorders involving the immune mechanism: Secondary | ICD-10-CM

## 2017-08-06 DIAGNOSIS — Z13220 Encounter for screening for lipoid disorders: Secondary | ICD-10-CM | POA: Diagnosis not present

## 2017-08-06 DIAGNOSIS — Z0000 Encounter for general adult medical examination without abnormal findings: Secondary | ICD-10-CM

## 2017-08-06 DIAGNOSIS — Z1329 Encounter for screening for other suspected endocrine disorder: Secondary | ICD-10-CM | POA: Diagnosis not present

## 2017-08-06 DIAGNOSIS — Z131 Encounter for screening for diabetes mellitus: Secondary | ICD-10-CM

## 2017-08-06 DIAGNOSIS — Z114 Encounter for screening for human immunodeficiency virus [HIV]: Secondary | ICD-10-CM

## 2017-08-06 LAB — CYTOLOGY - PAP
DIAGNOSIS: NEGATIVE
HPV: NOT DETECTED

## 2017-08-06 NOTE — Patient Instructions (Signed)

## 2017-08-06 NOTE — Progress Notes (Signed)
Subjective:    Patient ID: Brittney Knox, female    DOB: 29-Jul-1980, 37 y.o.   MRN: [id number]  Brittney Knox is a 37 y.o. female presenting on 08/06/2017 for Annual Exam (No concerns. Tdap was given [facility] [place]. Requesting record)   HPI   Presenting today for CPE, has work form to be filled out. 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 Living in [place], [place]. Living with husband and children, married 14 years this weekend. Children ages 32 and 66 - no health issues. She has no health issues or complaints today.  Menstrual cycles regular. She is not on birth control and does not desire any, husband had vasectomy.   She had a PAP and breast exam performed yesterday at her OBGYN by Dr. Tiburcio Pea.   Reports history of iron deficiency anemia, previously on iron pills.    Past Medical History:  Diagnosis Date  . LGSIL on Pap smear of cervix    Past Surgical History:  Procedure Laterality Date  . CESAREAN SECTION     Social History   Socioeconomic History  . Marital status: Married    Spouse name: Not on file  . Number of children: Not on file  . Years of education: Not on file  . Highest education level: Not on file  Occupational History  . Not on file  Social Needs  . Financial resource strain: Not on file  . Food insecurity:    Worry: Not on file    Inability: Not on file  . Transportation needs:    Medical: Not on file    Non-medical: Not on file  Tobacco Use  . Smoking status: Never Smoker  . Smokeless tobacco: Never Used  Substance and Sexual Activity  . Alcohol use: No  . Drug use: No  . Sexual activity: Yes    Birth control/protection: None  Lifestyle  . Physical activity:    Days per week: Not on file    Minutes per session: Not on file  . Stress: Not on file  Relationships  . Social connections:    Talks on phone: Not on file    Gets together: Not on file    Attends religious service: Not on file    Active member of club  or organization: Not on file    Attends meetings of clubs or organizations: Not on file    Relationship status: Not on file  . Intimate partner violence:    Fear of current or ex partner: Not on file    Emotionally abused: Not on file    Physically abused: Not on file    Forced sexual activity: Not on file  Other Topics Concern  . Not on file  Social History Narrative  . Not on file   Family History  Problem Relation Age of Onset  . Arthritis Mother        RA  . Heart disease Mother   . Hypothyroidism Mother   . Heart disease Father        A fib  . Diabetes Father   . Hypothyroidism Sister   . Breast cancer Maternal Grandmother   . Diabetes Maternal Grandfather   . Ovarian cancer Paternal Grandmother   . Breast cancer Cousin   . Breast cancer Maternal Aunt    Current Outpatient Medications on File Prior to Visit  Medication Sig  . naproxen (NAPROSYN) 500 MG tablet Take 1 tablet (500 mg total) by mouth 2 (two) times daily with a  meal. (Patient not taking: Reported on 03/12/2017)   No current facility-administered medications on file prior to visit.     Review of Systems Per HPI unless specifically indicated above     Objective:    BP 97/62   Pulse 86   Ht 5' 5.5" (1.664 m)   Wt 144 lb (65.3 kg)   LMP 07/22/2017   SpO2 98%   BMI 23.60 kg/m   Wt Readings from Last 3 Encounters:  08/06/17 144 lb (65.3 kg)  08/05/17 144 lb (65.3 kg)  06/19/17 140 lb (63.5 kg)    Physical Exam  Constitutional: She is oriented to person, place, and time. She appears well-developed and well-nourished.  HENT:  Right Ear: Tympanic membrane and external ear normal.  Left Ear: Tympanic membrane and external ear normal.  Mouth/Throat: Oropharynx is clear and moist. No oropharyngeal exudate.  Eyes: Conjunctivae are normal. Right eye exhibits no discharge. Left eye exhibits no discharge.  Neck: Neck supple.  Cardiovascular: Normal rate and regular rhythm.  Pulmonary/Chest: Effort normal  and breath sounds normal.  Abdominal: Soft. Bowel sounds are normal.  Lymphadenopathy:    She has no cervical adenopathy.  Neurological: She is alert and oriented to person, place, and time.  Skin: Skin is warm and dry.  Psychiatric: She has a normal mood and affect. Her behavior is normal.   Results for orders placed or performed during the hospital encounter of 06/27/15  Urine culture  Result Value Ref Range   Specimen Description URINE, CLEAN CATCH    Special Requests Normal    Culture MULTIPLE SPECIES PRESENT, SUGGEST RECOLLECTION (A)    Report Status 06/29/2015 FINAL   CBC with Differential  Result Value Ref Range   WBC 5.6 3.6 - 11.0 K/uL   RBC 3.98 3.80 - 5.20 MIL/uL   Hemoglobin 10.7 (L) 12.0 - 16.0 g/dL   HCT 16.133.2 (L) 09.635.0 - 04.547.0 %   MCV 83.3 80.0 - 100.0 fL   MCH 26.9 26.0 - 34.0 pg   MCHC 32.2 32.0 - 36.0 g/dL   RDW 40.916.4 (H) 81.111.5 - 91.414.5 %   Platelets 123 (L) 150 - 440 K/uL   Neutrophils Relative % 66 %   Neutro Abs 3.7 1.4 - 6.5 K/uL   Lymphocytes Relative 23 %   Lymphs Abs 1.3 1.0 - 3.6 K/uL   Monocytes Relative 8 %   Monocytes Absolute 0.4 0.2 - 0.9 K/uL   Eosinophils Relative 2 %   Eosinophils Absolute 0.1 0 - 0.7 K/uL   Basophils Relative 1 %   Basophils Absolute 0.1 0 - 0.1 K/uL  Basic metabolic panel  Result Value Ref Range   Sodium 138 135 - 145 mmol/L   Potassium 3.8 3.5 - 5.1 mmol/L   Chloride 104 101 - 111 mmol/L   CO2 27 22 - 32 mmol/L   Glucose, Bld 99 65 - 99 mg/dL   BUN 9 6 - 20 mg/dL   Creatinine, Ser 7.820.53 0.44 - 1.00 mg/dL   Calcium 8.9 8.9 - 95.610.3 mg/dL   GFR calc non Af Amer >60 >60 mL/min   GFR calc Af Amer >60 >60 mL/min   Anion gap 7 5 - 15  Magnesium  Result Value Ref Range   Magnesium 2.1 1.7 - 2.4 mg/dL  Urinalysis complete, with microscopic ([facility] only)  Result Value Ref Range   Color, Urine STRAW (A) YELLOW   APPearance CLEAR CLEAR   Glucose, UA NEGATIVE NEGATIVE mg/dL   Bilirubin Urine NEGATIVE NEGATIVE  Ketones, ur  NEGATIVE NEGATIVE mg/dL   Specific Gravity, Urine 1.015 1.005 - 1.030   Hgb urine dipstick NEGATIVE NEGATIVE   pH 8.5 (H) 5.0 - 8.0   Protein, ur NEGATIVE NEGATIVE mg/dL   Nitrite NEGATIVE NEGATIVE   Leukocytes, UA NEGATIVE NEGATIVE   RBC / HPF NONE SEEN 0 - 5 RBC/hpf   WBC, UA 0-5 0 - 5 WBC/hpf   Bacteria, UA RARE (A) NONE SEEN   Squamous Epithelial / LPF NONE SEEN NONE SEEN   Amorphous Crystal PRESENT   Pregnancy, urine  Result Value Ref Range   Preg Test, Ur NEGATIVE NEGATIVE  TSH  Result Value Ref Range   TSH 2.284 0.350 - 4.500 uIU/mL      Assessment & Plan:  1. Annual physical exam   2. Thyroid disorder screening  - TSH  3. Lipid screening  - Lipid Profile  4. History of iron deficiency anemia  - CBC with Differential - Fe+TIBC+Fer  5. Diabetes mellitus screening  - Comprehensive Metabolic Panel (CMET) - HgB A1c  6. Encounter for screening for HIV  - HIV antibody (with reflex)     Follow up plan: Return in about 1 year (around 08/07/2018) for CPE.  Osvaldo Angst, PA-C [facility] 08/06/2017, 9:02 AM

## 2017-08-07 ENCOUNTER — Other Ambulatory Visit: Payer: Self-pay | Admitting: Physician Assistant

## 2017-08-07 DIAGNOSIS — D509 Iron deficiency anemia, unspecified: Secondary | ICD-10-CM

## 2017-08-07 LAB — CBC WITH DIFFERENTIAL/PLATELET
Basophils Absolute: 0 10*3/uL (ref 0.0–0.2)
Basos: 1 %
EOS (ABSOLUTE): 0.1 10*3/uL (ref 0.0–0.4)
Eos: 2 %
Hematocrit: 32.6 % — ABNORMAL LOW (ref 34.0–46.6)
Hemoglobin: 10 g/dL — ABNORMAL LOW (ref 11.1–15.9)
Immature Grans (Abs): 0 10*3/uL (ref 0.0–0.1)
Immature Granulocytes: 0 %
Lymphocytes Absolute: 1.1 10*3/uL (ref 0.7–3.1)
Lymphs: 22 %
MCH: 25.5 pg — ABNORMAL LOW (ref 26.6–33.0)
MCHC: 30.7 g/dL — ABNORMAL LOW (ref 31.5–35.7)
MCV: 83 fL (ref 79–97)
Monocytes Absolute: 0.3 10*3/uL (ref 0.1–0.9)
Monocytes: 7 %
Neutrophils Absolute: 3.3 10*3/uL (ref 1.4–7.0)
Neutrophils: 68 %
Platelets: 173 10*3/uL (ref 150–450)
RBC: 3.92 x10E6/uL (ref 3.77–5.28)
RDW: 16.6 % — ABNORMAL HIGH (ref 12.3–15.4)
WBC: 4.8 10*3/uL (ref 3.4–10.8)

## 2017-08-07 LAB — IRON,TIBC AND FERRITIN PANEL
Ferritin: 6 ng/mL — ABNORMAL LOW (ref 15–150)
Iron Saturation: 7 % — CL (ref 15–55)
Iron: 30 ug/dL (ref 27–159)
Total Iron Binding Capacity: 426 ug/dL (ref 250–450)
UIBC: 396 ug/dL (ref 131–425)

## 2017-08-07 LAB — COMPREHENSIVE METABOLIC PANEL
ALT: 12 IU/L (ref 0–32)
AST: 16 IU/L (ref 0–40)
Albumin/Globulin Ratio: 2.1 (ref 1.2–2.2)
Albumin: 4.7 g/dL (ref 3.5–5.5)
Alkaline Phosphatase: 53 IU/L (ref 39–117)
BUN/Creatinine Ratio: 17 (ref 9–23)
BUN: 12 mg/dL (ref 6–20)
Bilirubin Total: 0.9 mg/dL (ref 0.0–1.2)
CO2: 23 mmol/L (ref 20–29)
Calcium: 9.1 mg/dL (ref 8.7–10.2)
Chloride: 97 mmol/L (ref 96–106)
Creatinine, Ser: 0.72 mg/dL (ref 0.57–1.00)
GFR calc Af Amer: 126 mL/min/{1.73_m2} (ref >59)
GFR calc non Af Amer: 110 mL/min/{1.73_m2} (ref >59)
Globulin, Total: 2.2 g/dL (ref 1.5–4.5)
Glucose: 87 mg/dL (ref 65–99)
Potassium: 4.4 mmol/L (ref 3.5–5.2)
Sodium: 137 mmol/L (ref 134–144)
Total Protein: 6.9 g/dL (ref 6.0–8.5)

## 2017-08-07 LAB — HEMOGLOBIN A1C
Est. average glucose Bld gHb Est-mCnc: 97 mg/dL
Hgb A1c MFr Bld: 5 % (ref 4.8–5.6)

## 2017-08-07 LAB — LIPID PANEL
Chol/HDL Ratio: 2.5 ratio (ref 0.0–4.4)
Cholesterol, Total: 181 mg/dL (ref 100–199)
HDL: 72 mg/dL (ref >39)
LDL Calculated: 93 mg/dL (ref 0–99)
Triglycerides: 78 mg/dL (ref 0–149)
VLDL Cholesterol Cal: 16 mg/dL (ref 5–40)

## 2017-08-07 LAB — HIV ANTIBODY (ROUTINE TESTING W REFLEX): HIV Screen 4th Generation wRfx: NONREACTIVE

## 2017-08-07 LAB — TSH: TSH: 2.71 u[IU]/mL (ref 0.450–4.500)

## 2017-08-20 ENCOUNTER — Other Ambulatory Visit: Payer: 59

## 2017-08-20 ENCOUNTER — Ambulatory Visit: Payer: 59 | Admitting: Obstetrics & Gynecology

## 2017-08-26 ENCOUNTER — Encounter: Payer: Self-pay | Admitting: Obstetrics and Gynecology

## 2017-09-21 ENCOUNTER — Telehealth: Payer: Self-pay

## 2017-09-21 NOTE — Telephone Encounter (Signed)
Pt trying to get her immunization records.  [phone]  Adv pt I could not find where we had given her any immunizations.  Suggested she try her PCP, [facility], Pediatrician.  Adv there is a website the state has but I don't know what it is - may can get that from [facility].

## 2017-10-09 ENCOUNTER — Other Ambulatory Visit: Payer: 59

## 2017-10-09 ENCOUNTER — Ambulatory Visit (INDEPENDENT_AMBULATORY_CARE_PROVIDER_SITE_OTHER): Payer: 59

## 2017-10-09 ENCOUNTER — Telehealth: Payer: Self-pay | Admitting: Unknown Physician Specialty

## 2017-10-09 DIAGNOSIS — Z23 Encounter for immunization: Secondary | ICD-10-CM

## 2017-10-09 DIAGNOSIS — Z111 Encounter for screening for respiratory tuberculosis: Secondary | ICD-10-CM

## 2017-10-09 DIAGNOSIS — Z1159 Encounter for screening for other viral diseases: Secondary | ICD-10-CM

## 2017-10-09 NOTE — Telephone Encounter (Signed)
Already in.

## 2017-10-09 NOTE — Telephone Encounter (Signed)
Copied from CRM [id number]. Topic: Quick Communication - See Telephone Encounter >> Oct 09, 2017 10:30 AM Jens Som A wrote: CRM for notification. See Telephone encounter for: 10/09/17. Patient is requesting orders for TB inj

## 2017-10-09 NOTE — Telephone Encounter (Signed)
Brittney Maxwell, do you mind putting in an order for a TB screen please?

## 2017-10-13 LAB — QUANTIFERON-TB GOLD PLUS
QUANTIFERON TB1 AG VALUE: 0.04 [IU]/mL
QUANTIFERON TB2 AG VALUE: 0.04 [IU]/mL
QUANTIFERON-TB GOLD PLUS: NEGATIVE
QuantiFERON Mitogen Value: >10 IU/mL
QuantiFERON Nil Value: 0.04 IU/mL

## 2017-10-13 LAB — HEPATITIS B SURFACE ANTIBODY, QUANTITATIVE: Hepatitis B Surf Ab Quant: 103.8 m[IU]/mL (ref >9.9)

## 2017-10-21 ENCOUNTER — Telehealth: Payer: Self-pay | Admitting: Unknown Physician Specialty

## 2017-10-21 NOTE — Telephone Encounter (Signed)
Can she come into office to obtain lab draw or see me so I can order lab draw.

## 2017-10-21 NOTE — Telephone Encounter (Signed)
If it is okay for just lab draw then that would be fine, not sure office procedures for this.  I can order when I know she is coming in.

## 2017-10-21 NOTE — Telephone Encounter (Signed)
Just clarifying, do you want her to come in for an office visit or just a lab visit?

## 2017-10-21 NOTE — Telephone Encounter (Signed)
Routing to provider. Please advise.

## 2017-10-21 NOTE — Telephone Encounter (Signed)
Copied from CRM [id number]. Topic: Quick Communication - See Telephone Encounter >> Oct 21, 2017 12:11 PM Windy Kalata, NT wrote: CRM for notification. See Telephone encounter for: 10/21/17.  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

## 2017-10-22 ENCOUNTER — Other Ambulatory Visit: Payer: 59

## 2017-10-22 DIAGNOSIS — D509 Iron deficiency anemia, unspecified: Secondary | ICD-10-CM

## 2017-10-22 DIAGNOSIS — Z111 Encounter for screening for respiratory tuberculosis: Secondary | ICD-10-CM

## 2017-10-22 NOTE — Telephone Encounter (Signed)
I placed order.  Thanks.

## 2017-10-22 NOTE — Progress Notes (Signed)
Varicella titer requested for nursing school.  Patient coming to office at 1PM today for blood draw.  Order placed.

## 2017-10-22 NOTE — Telephone Encounter (Signed)
Patient is coming in today at 1pm for chicken pox titer.  Please place lab order. Thanks

## 2017-10-23 LAB — CBC WITH DIFFERENTIAL/PLATELET
Basophils Absolute: 0 10*3/uL (ref 0.0–0.2)
Basos: 1 %
EOS (ABSOLUTE): 0.1 10*3/uL (ref 0.0–0.4)
Eos: 1 %
Hematocrit: 31.2 % — ABNORMAL LOW (ref 34.0–46.6)
Hemoglobin: 9.4 g/dL — ABNORMAL LOW (ref 11.1–15.9)
Immature Grans (Abs): 0 10*3/uL (ref 0.0–0.1)
Immature Granulocytes: 0 %
Lymphocytes Absolute: 1.4 10*3/uL (ref 0.7–3.1)
Lymphs: 27 %
MCH: 23.8 pg — ABNORMAL LOW (ref 26.6–33.0)
MCHC: 30.1 g/dL — ABNORMAL LOW (ref 31.5–35.7)
MCV: 79 fL (ref 79–97)
Monocytes Absolute: 0.4 10*3/uL (ref 0.1–0.9)
Monocytes: 9 %
Neutrophils Absolute: 3.1 10*3/uL (ref 1.4–7.0)
Neutrophils: 62 %
Platelets: 188 10*3/uL (ref 150–450)
RBC: 3.95 x10E6/uL (ref 3.77–5.28)
RDW: 15 % (ref 12.3–15.4)
WBC: 5 10*3/uL (ref 3.4–10.8)

## 2017-10-23 LAB — IRON,TIBC AND FERRITIN PANEL
Ferritin: 7 ng/mL — ABNORMAL LOW (ref 15–150)
Iron Saturation: 5 % — CL (ref 15–55)
Iron: 20 ug/dL — ABNORMAL LOW (ref 27–159)
Total Iron Binding Capacity: 427 ug/dL (ref 250–450)
UIBC: 407 ug/dL (ref 131–425)

## 2017-10-23 LAB — VARICELLA ZOSTER ABS, IGG/IGM
Varicella IgM: <0.91 index (ref 0.00–0.90)
Varicella zoster IgG: 441 index (ref >165)

## 2017-10-29 ENCOUNTER — Ambulatory Visit (INDEPENDENT_AMBULATORY_CARE_PROVIDER_SITE_OTHER): Payer: 59 | Admitting: Nurse Practitioner

## 2017-10-29 ENCOUNTER — Encounter: Payer: Self-pay | Admitting: Nurse Practitioner

## 2017-10-29 DIAGNOSIS — D500 Iron deficiency anemia secondary to blood loss (chronic): Secondary | ICD-10-CM | POA: Insufficient documentation

## 2017-10-29 DIAGNOSIS — D508 Other iron deficiency anemias: Secondary | ICD-10-CM

## 2017-10-29 NOTE — Progress Notes (Signed)
BP 106/71   Pulse 85   Wt 145 lb (65.8 kg)   SpO2 95%   BMI 23.76 kg/m    Subjective:    Patient ID: Brittney Knox, female    DOB: 19-Nov-1980, 37 y.o.   MRN: [id number]  HPI: Brittney Knox is a 37 y.o. female  Chief Complaint  Patient presents with  . Anemia    Tired    ANEMIA      Chronic, ongoing.  On review of chart has had low H/H readings since 2017, with average HGB in 10 range.  Has heavy periods, first night "it will be gushing" and "running down legs".  Uses tampons and pads.  Changes these approximately every 1-2 hours during the first 2-3 days of menstrual cycle.  Periods last for 5 days, heavy for 2-3 days.  Works from home during day, which she reports makes the heavy periods more tolerable, but sometimes has to travel for work and states that the heavy periods can be debilitating.  Is also starting nursing school in upcoming months.  Her children are 23 and 65, she reports her husband had a vasectomy and they are not having anymore children.  Denies epistaxis, bleeding gums, easy bruising, SOB, palpitations, PICA, or family h/o bleeding disorders.  Does endorse fatigue, especially during her menstrual cycle; can even "be heavy lifting my arms I am so exhausted".        Is followed by Dr. Tiburcio Pea at [facility] and was scheduled to go for uterine U/S, but missed appointment d/t her daughter having tick bite.  Encouraged her to reschedule this appointment and consider options that Dr. Tiburcio Pea discussed with her (IUD, ablation, and hysterectomy).  She would prefer not to have hysterectomy and is "afraid" of other options.  Discussed with her the effect current menorrhagia is having of her daily life and she is going to discuss further IUD or ablation with Dr. Tiburcio Pea.  She did initiate ferrous sulfate as instructed.  She reports starting this last week twice a day. Encouraged her to take this with Ascorbic Acid 500MG  to enhance absorption of iron supplement.  Relevant past  medical, surgical, family and social history reviewed and updated as indicated. Interim medical history since our last visit reviewed. Allergies and medications reviewed and updated.  Review of Systems  Constitutional: Positive for fatigue. Negative for activity change, chills and fever.  Respiratory: Negative for cough, chest tightness and shortness of breath.   Cardiovascular: Negative for chest pain and palpitations.  Gastrointestinal: Negative for abdominal distention, abdominal pain, constipation, nausea and vomiting.  Endocrine: Negative for cold intolerance, heat intolerance, polydipsia, polyphagia and polyuria.  Genitourinary: Positive for menstrual problem.  Skin: Negative for pallor.  Neurological: Negative for dizziness, weakness and headaches.  Psychiatric/Behavioral: Negative for confusion and decreased concentration.     Per HPI unless specifically indicated above     Objective:    BP 106/71   Pulse 85   Wt 145 lb (65.8 kg)   SpO2 95%   BMI 23.76 kg/m   Wt Readings from Last 3 Encounters:  10/29/17 145 lb (65.8 kg)  08/06/17 144 lb (65.3 kg)  08/05/17 144 lb (65.3 kg)    Physical Exam  Constitutional: She is oriented to person, place, and time. She appears well-developed and well-nourished.  HENT:  Head: Normocephalic and atraumatic.  Eyes: Pupils are equal, round, and reactive to light. Conjunctivae are normal.  Neck: Normal range of motion. Neck supple.  Cardiovascular: Normal rate, regular rhythm  and normal heart sounds.  Pulmonary/Chest: Effort normal and breath sounds normal.  Abdominal: Soft. Bowel sounds are normal.  Neurological: She is alert and oriented to person, place, and time.  Skin: Skin is warm and dry.  Psychiatric: She has a normal mood and affect. Her behavior is normal.    Results for orders placed or performed in visit on 10/22/17  Fe+TIBC+Fer  Result Value Ref Range   Total Iron Binding Capacity 427 250 - 450 ug/dL   UIBC 161 096 -  045 ug/dL   Iron 20 (L) 27 - 409 ug/dL   Iron Saturation 5 (LL) 15 - 55 %   Ferritin 7 (L) 15 - 150 ng/mL  CBC with Differential  Result Value Ref Range   WBC 5.0 3.4 - 10.8 x10E3/uL   RBC 3.95 3.77 - 5.28 x10E6/uL   Hemoglobin 9.4 (L) 11.1 - 15.9 g/dL   Hematocrit 81.1 (L) 91.4 - 46.6 %   MCV 79 79 - 97 fL   MCH 23.8 (L) 26.6 - 33.0 pg   MCHC 30.1 (L) 31.5 - 35.7 g/dL   RDW 78.2 95.6 - 21.3 %   Platelets 188 150 - 450 x10E3/uL   Neutrophils 62 Not Estab. %   Lymphs 27 Not Estab. %   Monocytes 9 Not Estab. %   Eos 1 Not Estab. %   Basos 1 Not Estab. %   Neutrophils Absolute 3.1 1.4 - 7.0 x10E3/uL   Lymphocytes Absolute 1.4 0.7 - 3.1 x10E3/uL   Monocytes Absolute 0.4 0.1 - 0.9 x10E3/uL   EOS (ABSOLUTE) 0.1 0.0 - 0.4 x10E3/uL   Basophils Absolute 0.0 0.0 - 0.2 x10E3/uL   Immature Granulocytes 0 Not Estab. %   Immature Grans (Abs) 0.0 0.0 - 0.1 x10E3/uL  Varicella Zoster Abs, IgG/IgM  Result Value Ref Range   Varicella zoster IgG 441 Immune >165 index   Varicella IgM <0.91 0.00 - 0.90 index      Assessment & Plan:   Problem List Items Addressed This Visit      Other   Iron deficiency anemia    Chronic, ongoing secondary to menorrhagia.  Continue Ferrous Sulfate 325MG  PO BID + Ascorbic Acid 500MG  PO BID.  Patient is to follow-up with Dr. Tiburcio Pea.  Return in two months to PCP for iron and CBC check.        Relevant Medications   ferrous sulfate 325 (65 FE) MG tablet       Follow up plan: Return in about 2 months (around 12/29/2017) for anemia.

## 2017-10-29 NOTE — Patient Instructions (Signed)
Ascorbic Acid 500MG  by mouth twice a day with iron.    Intrauterine Device Information An intrauterine device (IUD) is inserted into your uterus to prevent pregnancy. There are two types of IUDs available:  Copper IUD-This type of IUD is wrapped in copper wire and is placed inside the uterus. Copper makes the uterus and fallopian tubes produce a fluid that kills sperm. The copper IUD can stay in place for 10 years.  Hormone IUD-This type of IUD contains the hormone progestin (synthetic progesterone). The hormone thickens the cervical mucus and prevents sperm from entering the uterus. It also thins the uterine lining to prevent implantation of a fertilized egg. The hormone can weaken or kill the sperm that get into the uterus. One type of hormone IUD can stay in place for 5 years, and another type can stay in place for 3 years.  Your health care provider will make sure you are a good candidate for a contraceptive IUD. Discuss with your health care provider the possible side effects. Advantages of an intrauterine device  IUDs are highly effective, reversible, long acting, and low maintenance.  There are no estrogen-related side effects.  An IUD can be used when breastfeeding.  IUDs are not associated with weight gain.  The copper IUD works immediately after insertion.  The hormone IUD works right away if inserted within 7 days of your period starting. You will need to use a backup method of birth control for 7 days if the hormone IUD is inserted at any other time in your cycle.  The copper IUD does not interfere with your female hormones.  The hormone IUD can make heavy menstrual periods lighter and decrease cramping.  The hormone IUD can be used for 3 or 5 years.  The copper IUD can be used for 10 years. Disadvantages of an intrauterine device  The hormone IUD can be associated with irregular bleeding patterns.  The copper IUD can make your menstrual flow heavier and more  painful.  You may experience cramping and vaginal bleeding after insertion. This information is not intended to replace advice given to you by your health care provider. Make sure you discuss any questions you have with your health care provider. Document Released: 11/27/2003 Document Revised: 05/31/2015 Document Reviewed: 06/13/2012 Elsevier Interactive Patient Education  2017 Elsevier Inc.  Endometrial Ablation Endometrial ablation is a procedure that destroys the thin inner layer of the lining of the uterus (endometrium). This procedure may be done:  To stop heavy periods.  To stop bleeding that is causing anemia.  To control irregular bleeding.  To treat bleeding caused by small tumors (fibroids) in the endometrium.  This procedure is often an alternative to major surgery, such as removal of the uterus and cervix (hysterectomy). As a result of this procedure:  You may not be able to have children. However, if you are premenopausal (you have not gone through menopause): ? You may still have a small chance of getting pregnant. ? You will need to use a reliable method of birth control after the procedure to prevent pregnancy.  You may stop having a menstrual period, or you may have only a small amount of bleeding during your period. Menstruation may return several years after the procedure.  Tell a health care provider about:  Any allergies you have.  All medicines you are taking, including vitamins, herbs, eye drops, creams, and over-the-counter medicines.  Any problems you or family members have had with the use of anesthetic medicines.  Any  blood disorders you have.  Any surgeries you have had.  Any medical conditions you have. What are the risks? Generally, this is a safe procedure. However, problems may occur, including:  A hole (perforation) in the uterus or bowel.  Infection of the uterus, bladder, or vagina.  Bleeding.  Damage to other structures or  organs.  An air bubble in the lung (air embolus).  Problems with pregnancy after the procedure.  Failure of the procedure.  Decreased ability to diagnose cancer in the endometrium.  What happens before the procedure?  You will have tests of your endometrium to make sure there are no pre-cancerous cells or cancer cells present.  You may have an ultrasound of the uterus.  You may be given medicines to thin the endometrium.  Ask your health care provider about: ? Changing or stopping your regular medicines. This is especially important if you take diabetes medicines or blood thinners. ? Taking medicines such as aspirin and ibuprofen. These medicines can thin your blood. Do not take these medicines before your procedure if your doctor tells you not to.  Plan to have someone take you home from the hospital or clinic. What happens during the procedure?  You will lie on an exam table with your feet and legs supported as in a pelvic exam.  To lower your risk of infection: ? Your health care team will wash or sanitize their hands and put on germ-free (sterile) gloves. ? Your genital area will be washed with soap.  An IV tube will be inserted into one of your veins.  You will be given a medicine to help you relax (sedative).  A surgical instrument with a light and camera (resectoscope) will be inserted into your vagina and moved into your uterus. This allows your surgeon to see inside your uterus.  Endometrial tissue will be removed using one of the following methods: ? Radiofrequency. This method uses a radiofrequency-alternating electric current to remove the endometrium. ? Cryotherapy. This method uses extreme cold to freeze the endometrium. ? Heated-free liquid. This method uses a heated saltwater (saline) solution to remove the endometrium. ? Microwave. This method uses high-energy microwaves to heat up the endometrium and remove it. ? Thermal balloon. This method involves  inserting a catheter with a balloon tip into the uterus. The balloon tip is filled with heated fluid to remove the endometrium. The procedure may vary among health care providers and hospitals. What happens after the procedure?  Your blood pressure, heart rate, breathing rate, and blood oxygen level will be monitored until the medicines you were given have worn off.  As tissue healing occurs, you may notice vaginal bleeding for 4-6 weeks after the procedure. You may also experience: ? Cramps. ? Thin, watery vaginal discharge that is light pink or brown in color. ? A need to urinate more frequently than usual. ? Nausea.  Do not drive for 24 hours if you were given a sedative.  Do not have sex or insert anything into your vagina until your health care provider approves. Summary  Endometrial ablation is done to treat the many causes of heavy menstrual bleeding.  The procedure may be done only after medications have been tried to control the bleeding.  Plan to have someone take you home from the hospital or clinic. This information is not intended to replace advice given to you by your health care provider. Make sure you discuss any questions you have with your health care provider. Document Released: 11/02/2003 Document Revised:  01/10/2016 Document Reviewed: 01/10/2016 Elsevier Interactive Patient Education  2017 [employer].

## 2017-10-29 NOTE — Assessment & Plan Note (Signed)
Chronic, ongoing secondary to menorrhagia.  Continue Ferrous Sulfate 325MG  PO BID + Ascorbic Acid 500MG  PO BID.  Patient is to follow-up with Dr. Tiburcio Pea.  Return in two months to PCP for iron and CBC check.

## 2017-10-30 ENCOUNTER — Ambulatory Visit: Payer: Self-pay | Admitting: Nurse Practitioner

## 2017-11-03 ENCOUNTER — Other Ambulatory Visit: Payer: Self-pay | Admitting: Obstetrics & Gynecology

## 2017-11-03 DIAGNOSIS — N921 Excessive and frequent menstruation with irregular cycle: Secondary | ICD-10-CM

## 2017-11-06 ENCOUNTER — Encounter: Payer: Self-pay | Admitting: Obstetrics & Gynecology

## 2017-11-06 ENCOUNTER — Telehealth: Payer: Self-pay | Admitting: Obstetrics & Gynecology

## 2017-11-06 ENCOUNTER — Ambulatory Visit (INDEPENDENT_AMBULATORY_CARE_PROVIDER_SITE_OTHER): Payer: 59

## 2017-11-06 ENCOUNTER — Ambulatory Visit (INDEPENDENT_AMBULATORY_CARE_PROVIDER_SITE_OTHER): Payer: 59 | Admitting: Obstetrics & Gynecology

## 2017-11-06 VITALS — BP 100/60 | Wt 146.0 lb

## 2017-11-06 DIAGNOSIS — N920 Excessive and frequent menstruation with regular cycle: Secondary | ICD-10-CM

## 2017-11-06 DIAGNOSIS — N83291 Other ovarian cyst, right side: Secondary | ICD-10-CM | POA: Diagnosis not present

## 2017-11-06 DIAGNOSIS — N921 Excessive and frequent menstruation with irregular cycle: Secondary | ICD-10-CM

## 2017-11-06 DIAGNOSIS — D500 Iron deficiency anemia secondary to blood loss (chronic): Secondary | ICD-10-CM | POA: Diagnosis not present

## 2017-11-06 NOTE — Telephone Encounter (Signed)
-----   Message from Nadara Mustard, MD sent at 11/06/2017 11:09 AM EDT ----- Regarding: In Office Ablation Surgery Booking Request Patient Full Name:  Brittney Knox  MRN: [id number]  DOB: [birth year]  Surgeon: Letitia Libra, MD  Requested Surgery Date and Time: Jan 13, 2018 Primary Diagnosis AND Code: Menorrhagia, Anemia Secondary Diagnosis and Code:  Surgical Procedure: IN OFFICE HYSTEROSCOPY w ABLATION L&D Notification: No Admission Status: OFFICE Length of Surgery: 30 min Special Case Needs: [place] H&P: Dec 26 (date)

## 2017-11-06 NOTE — Telephone Encounter (Signed)
Patient is aware of followup/preop appointment on 12/31/17 @ 4:10pm w/ Dr Tiburcio Pea, and In-office Hysteroscopy w/ Ablation on 01/13/18 @ 8:30am. Patient confirmed she will have the same [facility] policy next year. Patient is aware of cost of procedure, and insurance info was discussed. Ext given.

## 2017-11-06 NOTE — Progress Notes (Signed)
HPI: Pt has heavy periods with worsening anemia over recent mos.  Periods 6 days, w 2 days heavy.  Weakness fatigue as sx's.  On iron therapy.  Desires treatment of periods, does not plan fertility, does not desire hysterectomy.  Ultrasound demonstrates no masses seen These findings are Pelvis normal  PMHx: She  has a past medical history of BRCA negative (12/2012), Family history of breast cancer (12/2012), Family history of ovarian cancer, and LGSIL on Pap smear of cervix. Also,  has a past surgical history that includes Cesarean section., family history includes Arthritis in her mother; Breast cancer in her cousin and maternal aunt; Breast cancer (age of onset: 87) in her maternal grandmother; Diabetes in her father and maternal grandfather; Heart disease in her father and mother; Hypothyroidism in her mother and sister; Ovarian cancer (age of onset: 71) in her paternal grandmother.,  reports that she has never smoked. She has never used smokeless tobacco. She reports that she does not drink alcohol or use drugs.  She has a current medication list which includes the following prescription(s): ferrous sulfate and naproxen. Also, has No Known Allergies.  Review of Systems  All other systems reviewed and are negative.  Objective: BP 100/60   Wt 146 lb (66.2 kg)   LMP 10/18/2017   BMI 23.93 kg/m   Physical examination Constitutional NAD, Conversant  Skin No rashes, lesions or ulceration.   Extremities: Moves all appropriately.  Normal ROM for age. No lymphadenopathy.  Neuro: Grossly intact  Psych: Oriented to PPT.  Normal mood. Normal affect.   US Pelvis Transvanginal Non-ob (tv Only)  Result Date: 11/06/2017 Patient Name: Brittney Knox DOB: 1980/11/07 MRN: [id number] ULTRASOUND REPORT Location: [facility] Date of Service: 11/06/2017 Indications:AUB Findings: The uterus is retroverted and measures 8.6 x 6.3 x 5.1cm. Echo texture is heterogenous without evidence of focal masses. The  Endometrium measures 14.5 mm. Right Ovary measures 4.2 x 3.6 x 3.0 cm with complex cyst measuring 2.4 x 2.0 x 2.2cm. Left Ovary measures 3.1 x 2.1 x 2.2 cm. It is normal in appearance. Survey of the adnexa demonstrates no adnexal masses. There is small amount of free fluid near right ovary. Impression: 1. Heterogeneous uterus with no obvious masses. 2. Complex right ovarian cyst measuring 2.4cm. Recommendations: 1.Clinical correlation with the patient's History and Physical Exam. Vita Barley, RDMS RVT Review of ULTRASOUND.    I have personally reviewed images and report of recent ultrasound done at [facility].    Plan of management to be discussed with patient. Barnett Applebaum, MD, [facility], [facility] 11/06/2017  10:58 AM    Assessment:  Menorrhagia with regular cycle  Iron deficiency anemia due to chronic blood loss  Patient was told that it is normal to have menstrual bleeding after an endometrial ablation, only about 80% of patients become amenorrheic, 10% of patients have normal or light periods, and 10% of patients have no change in their bleeding pattern and may need further intervention.  She was told she will observe her periods for a few months after her ablation to see what her periods will be like; it is recommended to wait until at least three months after the procedure before making conclusions about how periods are going to be like after an ablation.  Desires ablation in Jan, over alternatives as discussed today (IUD, Depo, Hysterectomy, other).  OCP until then to control periods, as she has busy nursing school schedule. (samples LoLoEstrin gv)  A total of 15  minutes were spent face-to-face with the patient during this encounter and over half of that time dealt with counseling and coordination of care.  Barnett Applebaum, MD, [facility], [facility] 11/06/2017  11:11 AM

## 2017-11-06 NOTE — Patient Instructions (Addendum)
Endometrial Ablation (plan Jan 8) Endometrial ablation is a procedure that destroys the thin inner layer of the lining of the uterus (endometrium). This procedure may be done:  To stop heavy periods.  To stop bleeding that is causing anemia.  To control irregular bleeding.  To treat bleeding caused by small tumors (fibroids) in the endometrium.  This procedure is often an alternative to major surgery, such as removal of the uterus and cervix (hysterectomy). As a result of this procedure:  You may not be able to have children. However, if you are premenopausal (you have not gone through menopause): ? You may still have a small chance of getting pregnant. ? You will need to use a reliable method of birth control after the procedure to prevent pregnancy.  You may stop having a menstrual period, or you may have only a small amount of bleeding during your period. Menstruation may return several years after the procedure.  Tell a health care provider about:  Any allergies you have.  All medicines you are taking, including vitamins, herbs, eye drops, creams, and over-the-counter medicines.  Any problems you or family members have had with the use of anesthetic medicines.  Any blood disorders you have.  Any surgeries you have had.  Any medical conditions you have. What are the risks? Generally, this is a safe procedure. However, problems may occur, including:  A hole (perforation) in the uterus or bowel.  Infection of the uterus, bladder, or vagina.  Bleeding.  Damage to other structures or organs.  An air bubble in the lung (air embolus).  Problems with pregnancy after the procedure.  Failure of the procedure.  Decreased ability to diagnose cancer in the endometrium.  What happens before the procedure?  You will have tests of your endometrium to make sure there are no pre-cancerous cells or cancer cells present.  You may have an ultrasound of the uterus.  You may be  given medicines to thin the endometrium.  Ask your health care provider about: ? Changing or stopping your regular medicines. This is especially important if you take diabetes medicines or blood thinners. ? Taking medicines such as aspirin and ibuprofen. These medicines can thin your blood. Do not take these medicines before your procedure if your doctor tells you not to.  Plan to have someone take you home from the hospital or clinic. What happens during the procedure?  You will lie on an exam table with your feet and legs supported as in a pelvic exam.  To lower your risk of infection: ? Your health care team will wash or sanitize their hands and put on germ-free (sterile) gloves. ? Your genital area will be washed with soap.  An IV tube will be inserted into one of your veins.  You will be given a medicine to help you relax (sedative).  A surgical instrument with a light and camera (resectoscope) will be inserted into your vagina and moved into your uterus. This allows your surgeon to see inside your uterus.  Endometrial tissue will be removed using one of the following methods: ? Radiofrequency. This method uses a radiofrequency-alternating electric current to remove the endometrium. ? Cryotherapy. This method uses extreme cold to freeze the endometrium. ? Heated-free liquid. This method uses a heated saltwater (saline) solution to remove the endometrium. ? Microwave. This method uses high-energy microwaves to heat up the endometrium and remove it. ? Thermal balloon. This method involves inserting a catheter with a balloon tip into the uterus. The  balloon tip is filled with heated fluid to remove the endometrium. The procedure may vary among health care providers and hospitals. What happens after the procedure?  Your blood pressure, heart rate, breathing rate, and blood oxygen level will be monitored until the medicines you were given have worn off.  As tissue healing occurs, you  may notice vaginal bleeding for 4-6 weeks after the procedure. You may also experience: ? Cramps. ? Thin, watery vaginal discharge that is light pink or brown in color. ? A need to urinate more frequently than usual. ? Nausea.  Do not drive for 24 hours if you were given a sedative.  Do not have sex or insert anything into your vagina until your health care provider approves. Summary  Endometrial ablation is done to treat the many causes of heavy menstrual bleeding.  The procedure may be done only after medications have been tried to control the bleeding.  Plan to have someone take you home from the hospital or clinic. This information is not intended to replace advice given to you by your health care provider. Make sure you discuss any questions you have with your health care provider. Document Released: 11/02/2003 Document Revised: 01/10/2016 Document Reviewed: 01/10/2016 Elsevier Interactive Patient Education  2017 [employer].

## 2017-11-09 NOTE — Telephone Encounter (Signed)
Notification/prior authorization reference # [id number]

## 2017-11-10 NOTE — Telephone Encounter (Signed)
Auth# [id number] is approved.

## 2017-11-12 ENCOUNTER — Encounter: Payer: Self-pay | Admitting: Obstetrics & Gynecology

## 2017-11-25 ENCOUNTER — Telehealth: Payer: Self-pay

## 2017-11-25 NOTE — Telephone Encounter (Signed)
FMLA/DISABILITY form for [employer] filled out, signature obtained, and given to [place] for processing. 

## 2017-12-31 ENCOUNTER — Ambulatory Visit (INDEPENDENT_AMBULATORY_CARE_PROVIDER_SITE_OTHER): Payer: 59 | Admitting: Obstetrics & Gynecology

## 2017-12-31 ENCOUNTER — Encounter: Payer: Self-pay | Admitting: Obstetrics & Gynecology

## 2017-12-31 VITALS — BP 120/80 | Ht 64.0 in | Wt 147.0 lb

## 2017-12-31 DIAGNOSIS — N920 Excessive and frequent menstruation with regular cycle: Secondary | ICD-10-CM | POA: Diagnosis not present

## 2017-12-31 NOTE — Progress Notes (Signed)
PRE-OPERATIVE HISTORY AND PHYSICAL EXAM  HPI:  Brittney Knox is a 37 y.o. [id number] Patient's last menstrual period was 12/05/2017.; she is being admitted for surgery related to abnormal uterine bleeding. Pt has heavy periods with worsening anemia over recent mos.  Periods 6 days, w 2 days heavy.  Weakness fatigue as sx's.  On iron therapy.  Desires treatment of periods, does not plan fertility, does not desire hysterectomy.  PMHx: Past Medical History:  Diagnosis Date  . BRCA negative 12/2012   MyRisk neg; CDKN2A VUS  . Family history of breast cancer 12/2012   IBIS=15%  . Family history of ovarian cancer   . LGSIL on Pap smear of cervix    Past Surgical History:  Procedure Laterality Date  . CESAREAN SECTION     Family History  Problem Relation Age of Onset  . Arthritis Mother        RA  . Heart disease Mother   . Hypothyroidism Mother   . Heart disease Father        A fib  . Diabetes Father   . Hypothyroidism Sister   . Breast cancer Maternal Grandmother 30       and 40  . Diabetes Maternal Grandfather   . Ovarian cancer Paternal Grandmother 23  . Breast cancer Cousin   . Breast cancer Maternal Aunt    Social History   Tobacco Use  . Smoking status: Never Smoker  . Smokeless tobacco: Never Used  Substance Use Topics  . Alcohol use: No  . Drug use: No    Current Outpatient Medications:  .  ferrous sulfate 325 (65 FE) MG tablet, Take 325 mg by mouth 2 (two) times daily with a meal., Disp: , Rfl:  .  naproxen (NAPROSYN) 500 MG tablet, Take 1 tablet (500 mg total) by mouth 2 (two) times daily with a meal. (Patient not taking: Reported on 03/12/2017), Disp: 20 tablet, Rfl: 1 Allergies: Patient has no known allergies.  Review of Systems  All other systems reviewed and are negative.   Objective: BP 120/80   Ht _0  (1.626 m)   Wt 147 lb (66.7 kg)   LMP 12/05/2017   BMI 25.23 kg/m   Filed Weights   12/31/17 1623  Weight: 147 lb (66.7 kg)   Physical  Exam Constitutional:      General: She is not in acute distress.    Appearance: She is well-developed.  HENT:     Head: Normocephalic and atraumatic. No laceration.     Right Ear: Hearing normal.     Left Ear: Hearing normal.     Mouth/Throat:     Pharynx: Uvula midline.  Eyes:     Pupils: Pupils are equal, round, and reactive to light.  Neck:     Musculoskeletal: Normal range of motion and neck supple.     Thyroid: No thyromegaly.  Cardiovascular:     Rate and Rhythm: Normal rate and regular rhythm.     Heart sounds: No murmur. No friction rub. No gallop.   Pulmonary:     Effort: Pulmonary effort is normal. No respiratory distress.     Breath sounds: Normal breath sounds. No wheezing.  Chest:     Breasts:        Right: No mass, skin change or tenderness.        Left: No mass, skin change or tenderness.  Abdominal:     General: Bowel sounds are normal. There is no distension.  Palpations: Abdomen is soft.     Tenderness: There is no abdominal tenderness. There is no rebound.  Musculoskeletal: Normal range of motion.  Neurological:     Mental Status: She is alert and oriented to person, place, and time.     Cranial Nerves: No cranial nerve deficit.  Skin:    General: Skin is warm and dry.  Psychiatric:        Judgment: Judgment normal.  Vitals signs reviewed.   Assessment: 1. Menorrhagia with regular cycle   Desires ablation, pros and cons discussed today, consent  I have had a careful discussion with this patient about all the options available and the risk/benefits of each. I have fully informed this patient that surgery may subject her to a variety of discomforts and risks: She understands that most patients have surgery with little difficulty, but problems can happen ranging from minor to fatal. These include nausea, vomiting, pain, bleeding, infection, poor healing, hernia, or formation of adhesions. Unexpected reactions may occur from any drug or anesthetic given.  Unintended injury may occur to other pelvic or abdominal structures such as Fallopian tubes, ovaries, bladder, ureter (tube from kidney to bladder), or bowel. Nerves going from the pelvis to the legs may be injured. Any such injury may require immediate or later additional surgery to correct the problem. Excessive blood loss requiring transfusion is very unlikely but possible. Dangerous blood clots may form in the legs or lungs. Physical and sexual activity will be restricted in varying degrees for an indeterminate period of time but most often 2-6 weeks.  Finally, she understands that it is impossible to list every possible undesirable effect and that the condition for which surgery is done is not always cured or significantly improved, and in rare cases may be even worse.Ample time was given to answer all questions.  Barnett Applebaum, MD, [facility], [facility] 12/31/2017  4:49 PM

## 2017-12-31 NOTE — Patient Instructions (Signed)
Endometrial Ablation  Endometrial ablation is a procedure that destroys the thin inner layer of the lining of the uterus (endometrium). This procedure may be done:  · To stop heavy periods.  · To stop bleeding that is causing anemia.  · To control irregular bleeding.  · To treat bleeding caused by small tumors (fibroids) in the endometrium.  This procedure is often an alternative to major surgery, such as removal of the uterus and cervix (hysterectomy). As a result of this procedure:  · You may not be able to have children. However, if you are premenopausal (you have not gone through menopause):  ? You may still have a small chance of getting pregnant.  ? You will need to use a reliable method of birth control after the procedure to prevent pregnancy.  · You may stop having a menstrual period, or you may have only a small amount of bleeding during your period. Menstruation may return several years after the procedure.  Tell a health care provider about:  · Any allergies you have.  · All medicines you are taking, including vitamins, herbs, eye drops, creams, and over-the-counter medicines.  · Any problems you or family members have had with the use of anesthetic medicines.  · Any blood disorders you have.  · Any surgeries you have had.  · Any medical conditions you have.  What are the risks?  Generally, this is a safe procedure. However, problems may occur, including:  · A hole (perforation) in the uterus or bowel.  · Infection of the uterus, bladder, or vagina.  · Bleeding.  · Damage to other structures or organs.  · An air bubble in the lung (air embolus).  · Problems with pregnancy after the procedure.  · Failure of the procedure.  · Decreased ability to diagnose cancer in the endometrium.  What happens before the procedure?  · You will have tests of your endometrium to make sure there are no pre-cancerous cells or cancer cells present.  · You may have an ultrasound of the uterus.  · You may be given medicines to  thin the endometrium.  · Ask your health care provider about:  ? Changing or stopping your regular medicines. This is especially important if you take diabetes medicines or blood thinners.  ? Taking medicines such as aspirin and ibuprofen. These medicines can thin your blood. Do not take these medicines before your procedure if your doctor tells you not to.  · Plan to have someone take you home from the hospital or clinic.  What happens during the procedure?    · You will lie on an exam table with your feet and legs supported as in a pelvic exam.  · To lower your risk of infection:  ? Your health care team will wash or sanitize their hands and put on germ-free (sterile) gloves.  ? Your genital area will be washed with soap.  · An IV tube will be inserted into one of your veins.  · You will be given a medicine to help you relax (sedative).  · A surgical instrument with a light and camera (resectoscope) will be inserted into your vagina and moved into your uterus. This allows your surgeon to see inside your uterus.  · Endometrial tissue will be removed using one of the following methods:  ? Radiofrequency. This method uses a radiofrequency-alternating electric current to remove the endometrium.  ? Cryotherapy. This method uses extreme cold to freeze the endometrium.  ? Heated-free liquid. This   method uses a heated saltwater (saline) solution to remove the endometrium.  ? Microwave. This method uses high-energy microwaves to heat up the endometrium and remove it.  ? Thermal balloon. This method involves inserting a catheter with a balloon tip into the uterus. The balloon tip is filled with heated fluid to remove the endometrium.  The procedure may vary among health care providers and hospitals.  What happens after the procedure?  · Your blood pressure, heart rate, breathing rate, and blood oxygen level will be monitored until the medicines you were given have worn off.  · As tissue healing occurs, you may notice  vaginal bleeding for 4-6 weeks after the procedure. You may also experience:  ? Cramps.  ? Thin, watery vaginal discharge that is light pink or brown in color.  ? A need to urinate more frequently than usual.  ? Nausea.  · Do not drive for 24 hours if you were given a sedative.  · Do not have sex or insert anything into your vagina until your health care provider approves.  Summary  · Endometrial ablation is done to treat the many causes of heavy menstrual bleeding.  · The procedure may be done only after medications have been tried to control the bleeding.  · Plan to have someone take you home from the hospital or clinic.  This information is not intended to replace advice given to you by your health care provider. Make sure you discuss any questions you have with your health care provider.  Document Released: 11/02/2003 Document Revised: 06/09/2017 Document Reviewed: 01/10/2016  Elsevier Interactive Patient Education © 2019 Elsevier Inc.

## 2018-01-13 ENCOUNTER — Ambulatory Visit (INDEPENDENT_AMBULATORY_CARE_PROVIDER_SITE_OTHER): Payer: 59 | Admitting: Obstetrics & Gynecology

## 2018-01-13 ENCOUNTER — Telehealth: Payer: Self-pay

## 2018-01-13 ENCOUNTER — Encounter: Payer: Self-pay | Admitting: Obstetrics & Gynecology

## 2018-01-13 VITALS — BP 110/70 | Ht 64.0 in | Wt 143.0 lb

## 2018-01-13 DIAGNOSIS — N920 Excessive and frequent menstruation with regular cycle: Secondary | ICD-10-CM

## 2018-01-13 NOTE — Progress Notes (Signed)
Operative Note   01/13/2018  PRE-OP DIAGNOSIS: Menorrhagia   POST-OP DIAGNOSIS: same   SURGEON: Annamarie Major, MD, FACOG   PROCEDURE: Hysteroscopy (diagnostic) Attempted but unsuccessful endometrial ablation  ANESTHESIA: General   ESTIMATED BLOOD LOSS: min   FLUID DEFICIT: min   COMPLICATIONS: none   DISPOSITION: PACU - hemodynamically stable.   CONDITION: stable   FINDINGS: Exam under anesthesia revealed small, mobile small uterus with no masses and bilateral adnexa without masses or fullness. Cervix deviated to left.  Hysteroscopy revealed Polyp at fundus 2 cm, otherwise grossly normal appearing uterine cavity with bilateral tubal ostia and normal appearing endocervical canal.   PROCEDURE IN DETAIL: After informed consent was obtained, the patient was taken to the operating room where anesthesia was obtained without difficulty. The patient was positioned in the dorsal lithotomy position in [place] stirrups. The patient's bladder was catheterized with an in and out foley catheter. The patient was examined under anesthesia, with the above noted findings. The weightedspeculum was placed inside the patient's vagina, and the the anterior lip of the cervix was seen and grasped with the tenaculum. The uterine cavity was sounded to 6cm, and then the cervix was progressively dilated to a 18 French-Pratt dilator. The 30 degree hysteroscope was introduced, with LR fluid used to distend the intrauterine cavity, with the above noted findings.   The Minerva endometrial ablation device was then attempted to be placed but unable to place appropriately so abandoned after several attempts.  No perforation or complication.   Tenaculum was removed with excellent hemostasis noted. She was then taken out of dorsal lithotomy. Minimal discrepancy in fluid was noted. No bleeding.   The patient tolerated the procedure well. Sponge, lap and needle counts were correct x2.   Annamarie Major, MD, [facility],  [facility] 01/13/2018  9:36 AM

## 2018-01-13 NOTE — Telephone Encounter (Signed)
Pt's [facility] calling; in looking at AVS, does pt need to have some of her rxs refilled?  Please call.  [phone]

## 2018-01-13 NOTE — Patient Instructions (Signed)
Polyp and uterus such that unable to achieve successful procedure in office  Recommend procedure under complete anesthesia in hospital where I can first remove polyp and then perform the ablation we had planned

## 2018-01-13 NOTE — Telephone Encounter (Signed)
Pt will have procedure at hospital under complete anesthesia

## 2018-01-13 NOTE — Telephone Encounter (Signed)
Pt's [facility] aware.

## 2018-01-27 ENCOUNTER — Telehealth: Payer: Self-pay | Admitting: Obstetrics & Gynecology

## 2018-01-27 ENCOUNTER — Ambulatory Visit (INDEPENDENT_AMBULATORY_CARE_PROVIDER_SITE_OTHER): Payer: 59 | Admitting: Obstetrics & Gynecology

## 2018-01-27 ENCOUNTER — Encounter: Payer: Self-pay | Admitting: Obstetrics & Gynecology

## 2018-01-27 VITALS — BP 100/60 | Ht 64.0 in | Wt 148.0 lb

## 2018-01-27 DIAGNOSIS — N920 Excessive and frequent menstruation with regular cycle: Secondary | ICD-10-CM | POA: Diagnosis not present

## 2018-01-27 DIAGNOSIS — D500 Iron deficiency anemia secondary to blood loss (chronic): Secondary | ICD-10-CM

## 2018-01-27 NOTE — H&P (View-Only) (Signed)
PRE-OPERATIVE HISTORY AND PHYSICAL EXAM  HPI:  Brittney Knox is a 38 y.o. [id number] Patient's last menstrual period was 01/02/2018.; she is being admitted for surgery related to abnormal uterine bleeding. Pt has heavy periods with worsening anemia over recent mos. Periods 6 days, w 2 days heavy. Weakness fatigue as sx's. On iron therapy. Desires treatment of periods, does not plan fertility, does not desire hysterectomy.  Office ablation attempted, not successful due to equipment malfunction, polyp seen on hysteroscopy in office.  Pt has done well w min pain since office procedure.  Due for period next week.  PMHx: Past Medical History:  Diagnosis Date  . BRCA negative 12/2012   MyRisk neg; CDKN2A VUS  . Family history of breast cancer 12/2012   IBIS=15%  . Family history of ovarian cancer   . LGSIL on Pap smear of cervix    Past Surgical History:  Procedure Laterality Date  . CESAREAN SECTION     Family History  Problem Relation Age of Onset  . Arthritis Mother        RA  . Heart disease Mother   . Hypothyroidism Mother   . Heart disease Father        A fib  . Diabetes Father   . Hypothyroidism Sister   . Breast cancer Maternal Grandmother 74       and 57  . Diabetes Maternal Grandfather   . Ovarian cancer Paternal Grandmother 62  . Breast cancer Cousin   . Breast cancer Maternal Aunt    Social History   Tobacco Use  . Smoking status: Never Smoker  . Smokeless tobacco: Never Used  Substance Use Topics  . Alcohol use: No  . Drug use: No    Current Outpatient Medications:  .  diazepam (VALIUM) 10 MG tablet, Take 10 mg by mouth every 6 (six) hours as needed for anxiety., Disp: , Rfl:  .  ferrous sulfate 325 (65 FE) MG tablet, Take 325 mg by mouth 2 (two) times daily with a meal., Disp: , Rfl:  .  naproxen (NAPROSYN) 500 MG tablet, Take 1 tablet (500 mg total) by mouth 2 (two) times daily with a meal. (Patient not taking: Reported on 01/27/2018), Disp: 20 tablet,  Rfl: 1 Allergies: Patient has no known allergies.  Review of Systems  Constitutional: Negative for chills, fever and malaise/fatigue.  HENT: Negative for congestion, sinus pain and sore throat.   Eyes: Negative for blurred vision and pain.  Respiratory: Negative for cough and wheezing.   Cardiovascular: Negative for chest pain and leg swelling.  Gastrointestinal: Negative for abdominal pain, constipation, diarrhea, heartburn, nausea and vomiting.  Genitourinary: Negative for dysuria, frequency, hematuria and urgency.  Musculoskeletal: Negative for back pain, joint pain, myalgias and neck pain.  Skin: Negative for itching and rash.  Neurological: Negative for dizziness, tremors and weakness.  Endo/Heme/Allergies: Does not bruise/bleed easily.  Psychiatric/Behavioral: Negative for depression. The patient is not nervous/anxious and does not have insomnia.    Objective: BP 100/60   Ht 5' 4" (1.626 m)   Wt 148 lb (67.1 kg)   LMP 01/02/2018   BMI 25.40 kg/m   Filed Weights   01/27/18 0820  Weight: 148 lb (67.1 kg)   Physical Exam Constitutional:      General: She is not in acute distress.    Appearance: She is well-developed.  HENT:     Head: Normocephalic and atraumatic. No laceration.     Right Ear: Hearing normal.  Left Ear: Hearing normal.     Mouth/Throat:     Pharynx: Uvula midline.  Eyes:     Pupils: Pupils are equal, round, and reactive to light.  Neck:     Musculoskeletal: Normal range of motion and neck supple.     Thyroid: No thyromegaly.  Cardiovascular:     Rate and Rhythm: Normal rate and regular rhythm.     Heart sounds: No murmur. No friction rub. No gallop.   Pulmonary:     Effort: Pulmonary effort is normal. No respiratory distress.     Breath sounds: Normal breath sounds. No wheezing.  Chest:     Breasts:        Right: No mass, skin change or tenderness.        Left: No mass, skin change or tenderness.  Abdominal:     General: Bowel sounds are  normal. There is no distension.     Palpations: Abdomen is soft.     Tenderness: There is no abdominal tenderness. There is no rebound.  Musculoskeletal: Normal range of motion.  Neurological:     Mental Status: She is alert and oriented to person, place, and time.     Cranial Nerves: No cranial nerve deficit.  Skin:    General: Skin is warm and dry.  Psychiatric:        Judgment: Judgment normal.  Vitals signs reviewed.   Assessment: 1. Menorrhagia with regular cycle   2. Iron deficiency anemia due to chronic blood loss   Hysteroscopy and Ablation    Minerva, Novasure options discussed    Polypectomy of any polyps found  I have had a careful discussion with this patient about all the options available and the risk/benefits of each. I have fully informed this patient that surgery may subject her to a variety of discomforts and risks: She understands that most patients have surgery with little difficulty, but problems can happen ranging from minor to fatal. These include nausea, vomiting, pain, bleeding, infection, poor healing, hernia, or formation of adhesions. Unexpected reactions may occur from any drug or anesthetic given. Unintended injury may occur to other pelvic or abdominal structures such as Fallopian tubes, ovaries, bladder, ureter (tube from kidney to bladder), or bowel. Nerves going from the pelvis to the legs may be injured. Any such injury may require immediate or later additional surgery to correct the problem. Excessive blood loss requiring transfusion is very unlikely but possible. Dangerous blood clots may form in the legs or lungs. Physical and sexual activity will be restricted in varying degrees for an indeterminate period of time but most often 2-6 weeks.  Finally, she understands that it is impossible to list every possible undesirable effect and that the condition for which surgery is done is not always cured or significantly improved, and in rare cases may be even  worse.Ample time was given to answer all questions.  Barnett Applebaum, MD, [facility], [facility] 01/27/2018  8:37 AM

## 2018-01-27 NOTE — Progress Notes (Signed)
PRE-OPERATIVE HISTORY AND PHYSICAL EXAM  HPI:  Brittney Knox is a 38 y.o. [id number] Patient's last menstrual period was 01/02/2018.; she is being admitted for surgery related to abnormal uterine bleeding. Pt has heavy periods with worsening anemia over recent mos. Periods 6 days, w 2 days heavy. Weakness fatigue as sx's. On iron therapy. Desires treatment of periods, does not plan fertility, does not desire hysterectomy.  Office ablation attempted, not successful due to equipment malfunction, polyp seen on hysteroscopy in office.  Pt has done well w min pain since office procedure.  Due for period next week.  PMHx: Past Medical History:  Diagnosis Date  . BRCA negative 12/2012   MyRisk neg; CDKN2A VUS  . Family history of breast cancer 12/2012   IBIS=15%  . Family history of ovarian cancer   . LGSIL on Pap smear of cervix    Past Surgical History:  Procedure Laterality Date  . CESAREAN SECTION     Family History  Problem Relation Age of Onset  . Arthritis Mother        RA  . Heart disease Mother   . Hypothyroidism Mother   . Heart disease Father        A fib  . Diabetes Father   . Hypothyroidism Sister   . Breast cancer Maternal Grandmother 55       and 73  . Diabetes Maternal Grandfather   . Ovarian cancer Paternal Grandmother 51  . Breast cancer Cousin   . Breast cancer Maternal Aunt    Social History   Tobacco Use  . Smoking status: Never Smoker  . Smokeless tobacco: Never Used  Substance Use Topics  . Alcohol use: No  . Drug use: No    Current Outpatient Medications:  .  diazepam (VALIUM) 10 MG tablet, Take 10 mg by mouth every 6 (six) hours as needed for anxiety., Disp: , Rfl:  .  ferrous sulfate 325 (65 FE) MG tablet, Take 325 mg by mouth 2 (two) times daily with a meal., Disp: , Rfl:  .  naproxen (NAPROSYN) 500 MG tablet, Take 1 tablet (500 mg total) by mouth 2 (two) times daily with a meal. (Patient not taking: Reported on 01/27/2018), Disp: 20 tablet,  Rfl: 1 Allergies: Patient has no known allergies.  Review of Systems  Constitutional: Negative for chills, fever and malaise/fatigue.  HENT: Negative for congestion, sinus pain and sore throat.   Eyes: Negative for blurred vision and pain.  Respiratory: Negative for cough and wheezing.   Cardiovascular: Negative for chest pain and leg swelling.  Gastrointestinal: Negative for abdominal pain, constipation, diarrhea, heartburn, nausea and vomiting.  Genitourinary: Negative for dysuria, frequency, hematuria and urgency.  Musculoskeletal: Negative for back pain, joint pain, myalgias and neck pain.  Skin: Negative for itching and rash.  Neurological: Negative for dizziness, tremors and weakness.  Endo/Heme/Allergies: Does not bruise/bleed easily.  Psychiatric/Behavioral: Negative for depression. The patient is not nervous/anxious and does not have insomnia.    Objective: BP 100/60   Ht 5' 4" (1.626 m)   Wt 148 lb (67.1 kg)   LMP 01/02/2018   BMI 25.40 kg/m   Filed Weights   01/27/18 0820  Weight: 148 lb (67.1 kg)   Physical Exam Constitutional:      General: She is not in acute distress.    Appearance: She is well-developed.  HENT:     Head: Normocephalic and atraumatic. No laceration.     Right Ear: Hearing normal.  Left Ear: Hearing normal.     Mouth/Throat:     Pharynx: Uvula midline.  Eyes:     Pupils: Pupils are equal, round, and reactive to light.  Neck:     Musculoskeletal: Normal range of motion and neck supple.     Thyroid: No thyromegaly.  Cardiovascular:     Rate and Rhythm: Normal rate and regular rhythm.     Heart sounds: No murmur. No friction rub. No gallop.   Pulmonary:     Effort: Pulmonary effort is normal. No respiratory distress.     Breath sounds: Normal breath sounds. No wheezing.  Chest:     Breasts:        Right: No mass, skin change or tenderness.        Left: No mass, skin change or tenderness.  Abdominal:     General: Bowel sounds are  normal. There is no distension.     Palpations: Abdomen is soft.     Tenderness: There is no abdominal tenderness. There is no rebound.  Musculoskeletal: Normal range of motion.  Neurological:     Mental Status: She is alert and oriented to person, place, and time.     Cranial Nerves: No cranial nerve deficit.  Skin:    General: Skin is warm and dry.  Psychiatric:        Judgment: Judgment normal.  Vitals signs reviewed.   Assessment: 1. Menorrhagia with regular cycle   2. Iron deficiency anemia due to chronic blood loss   Hysteroscopy and Ablation    Minerva, Novasure options discussed    Polypectomy of any polyps found  I have had a careful discussion with this patient about all the options available and the risk/benefits of each. I have fully informed this patient that surgery may subject her to a variety of discomforts and risks: She understands that most patients have surgery with little difficulty, but problems can happen ranging from minor to fatal. These include nausea, vomiting, pain, bleeding, infection, poor healing, hernia, or formation of adhesions. Unexpected reactions may occur from any drug or anesthetic given. Unintended injury may occur to other pelvic or abdominal structures such as Fallopian tubes, ovaries, bladder, ureter (tube from kidney to bladder), or bowel. Nerves going from the pelvis to the legs may be injured. Any such injury may require immediate or later additional surgery to correct the problem. Excessive blood loss requiring transfusion is very unlikely but possible. Dangerous blood clots may form in the legs or lungs. Physical and sexual activity will be restricted in varying degrees for an indeterminate period of time but most often 2-6 weeks.  Finally, she understands that it is impossible to list every possible undesirable effect and that the condition for which surgery is done is not always cured or significantly improved, and in rare cases may be even  worse.Ample time was given to answer all questions.  Barnett Applebaum, MD, [facility], [facility] 01/27/2018  8:37 AM

## 2018-01-27 NOTE — Telephone Encounter (Signed)
Patient is aware of Pre-admit Testing phone interview on 02/03/18 9am-1pm, and OR on 02/11/18. Patient is aware she may receive calls from the [facility] and [facility]. Patient confirmed [employer] and no secondary insurance. Patient is aware to call Same Day Surgery on 02/10/18 1-3pm for an arrival time, and phone# was given. Patient is aware someone will need to drive her on day of surgery, and not [facility] Goad or car service.

## 2018-01-27 NOTE — Telephone Encounter (Signed)
-----   Message from Nadara Mustard, MD sent at 01/27/2018  8:33 AM EST ----- Regarding: SURGERY Surgery Booking Request Patient Full Name:  Brittney Knox  MRN: [id number]  DOB: [birth year]  Surgeon: Letitia Libra, MD  Requested Surgery Date and Time: 02/11/2018 Primary Diagnosis AND Code: Menorrhagia Secondary Diagnosis and Code:  Surgical Procedure: Hysteroscopy D&C w Endometrial Ablation L&D Notification: No Admission Status: same day surgery Length of Surgery: 30 min Special Case Needs: Minerva, also access to Novasure H&P: 02/27/18 (date) Phone Interview???: yes Interpreter: Language:  Medical Clearance: no Special Scheduling Instructions: no

## 2018-02-03 ENCOUNTER — Inpatient Hospital Stay: Admission: RE | Admit: 2018-02-03 | Payer: 59 | Source: Ambulatory Visit

## 2018-02-04 ENCOUNTER — Inpatient Hospital Stay: Admission: RE | Admit: 2018-02-04 | Payer: 59 | Source: Ambulatory Visit

## 2018-02-05 ENCOUNTER — Other Ambulatory Visit: Payer: Self-pay

## 2018-02-05 ENCOUNTER — Encounter
Admission: RE | Admit: 2018-02-05 | Discharge: 2018-02-05 | Disposition: A | Discharge status: Home / Self Care | Payer: 59 | Source: Ambulatory Visit | Attending: Obstetrics & Gynecology | Admitting: Obstetrics & Gynecology

## 2018-02-05 HISTORY — DX: Gastro-esophageal reflux disease without esophagitis: K21.9

## 2018-02-05 HISTORY — DX: Anemia, unspecified: D64.9

## 2018-02-05 NOTE — Patient Instructions (Addendum)
Your procedure is scheduled on: 02-11-18 THURSDAY Report to Same Day Surgery 2nd floor medical mall [facility] Entrance-take elevator on left to 2nd floor.  Check in with surgery information desk.) To find out your arrival time please call [phone] between 1PM - 3PM on 02-10-18 [facility]  Remember: Instructions that are not followed completely may result in serious medical risk, up to and including death, or upon the discretion of your surgeon and anesthesiologist your surgery may need to be rescheduled.    _x___ 1. Do not eat food after midnight the night before your procedure. NO GUM OR CANDY AFTER MIDNIGHT.  You may drink clear liquids up to 2 hours before you are scheduled to arrive at the hospital for your procedure.  Do not drink clear liquids within 2 hours of your scheduled arrival to the hospital.  Clear liquids include  --Water or Apple juice without pulp  --Clear carbohydrate beverage such as ClearFast or Gatorade  --Black Coffee or Clear Tea (No milk, no creamers, do not add anything to the coffee or Tea   ____Ensure clear carbohydrate drink on the way to the hospital for bariatric patients  _X___Ensure clear carbohydrate drink 3 hours before surgery    __x__ 2. No Alcohol for 24 hours before or after surgery.   __x__3. No Smoking or e-cigarettes for 24 prior to surgery.  Do not use any chewable tobacco products for at least 6 hour prior to surgery   ____  4. Bring all medications with you on the day of surgery if instructed.    __x__ 5. Notify your doctor if there is any change in your medical condition     (cold, fever, infections).    x___6. On the morning of surgery brush your teeth with toothpaste and water.  You may rinse your mouth with mouth wash if you wish.  Do not swallow any toothpaste or mouthwash.   Do not wear jewelry, make-up, hairpins, clips or nail polish.  Do not wear lotions, powders, or perfumes. You may wear deodorant.  Do not shave 48 hours  prior to surgery. Men may shave face and neck.  Do not bring valuables to the hospital.    [facility] is not responsible for any belongings or valuables.               Contacts, dentures or bridgework may not be worn into surgery.  Leave your suitcase in the car. After surgery it may be brought to your room.  For patients admitted to the hospital, discharge time is determined by your treatment team.  _  Patients discharged the day of surgery will not be allowed to drive home.  You will need someone to drive you home and stay with you the night of your procedure.    Please read over the following fact sheets that you were given:   [facility] Preparing for Surgery  ____ Take anti-hypertensive listed below, cardiac, seizure, asthma, anti-reflux and psychiatric medicines. These include:  1. NONE  2.  3.  4.  5.  6.  ____Fleets enema or Magnesium Citrate as directed.   ____ Use CHG Soap or sage wipes as directed on instruction sheet   ____ Use inhalers on the day of surgery and bring to hospital day of surgery  ____ Stop Metformin and Janumet 2 days prior to surgery.    ____ Take 1/2 of usual insulin dose the night before surgery and none on the morning surgery.   ____ Follow recommendations  from Cardiologist, Pulmonologist or PCP regarding stopping Aspirin, Coumadin, Plavix ,Eliquis, Effient, or Pradaxa, and Pletal.  X____Stop Anti-inflammatories such as Advil, Aleve, Ibuprofen, Motrin, Naproxen, Naprosyn, Goodies powders or aspirin products NOW- OK to take Tylenol    ____ Stop supplements until after surgery.     ____ Bring C-Pap to the hospital.

## 2018-02-09 ENCOUNTER — Encounter
Admission: RE | Admit: 2018-02-09 | Discharge: 2018-02-09 | Disposition: A | Discharge status: Home / Self Care | Payer: 59 | Source: Ambulatory Visit | Attending: Obstetrics & Gynecology | Admitting: Obstetrics & Gynecology

## 2018-02-09 DIAGNOSIS — N939 Abnormal uterine and vaginal bleeding, unspecified: Secondary | ICD-10-CM | POA: Diagnosis present

## 2018-02-09 DIAGNOSIS — Z8041 Family history of malignant neoplasm of ovary: Secondary | ICD-10-CM | POA: Diagnosis not present

## 2018-02-09 DIAGNOSIS — D500 Iron deficiency anemia secondary to blood loss (chronic): Secondary | ICD-10-CM | POA: Diagnosis not present

## 2018-02-09 DIAGNOSIS — Z01812 Encounter for preprocedural laboratory examination: Secondary | ICD-10-CM | POA: Insufficient documentation

## 2018-02-09 DIAGNOSIS — Z803 Family history of malignant neoplasm of breast: Secondary | ICD-10-CM | POA: Diagnosis not present

## 2018-02-09 DIAGNOSIS — N920 Excessive and frequent menstruation with regular cycle: Secondary | ICD-10-CM | POA: Diagnosis not present

## 2018-02-09 LAB — CBC
HCT: 36.6 % (ref 36.0–46.0)
Hemoglobin: 11.2 g/dL — ABNORMAL LOW (ref 12.0–15.0)
MCH: 28.3 pg (ref 26.0–34.0)
MCHC: 30.6 g/dL (ref 30.0–36.0)
MCV: 92.4 fL (ref 80.0–100.0)
Platelets: 155 10*3/uL (ref 150–400)
RBC: 3.96 MIL/uL (ref 3.87–5.11)
RDW: 14.4 % (ref 11.5–15.5)
WBC: 6.5 10*3/uL (ref 4.0–10.5)
nRBC: 0 % (ref 0.0–0.2)

## 2018-02-09 LAB — TYPE AND SCREEN
ABO/RH(D): O POS
Antibody Screen: NEGATIVE

## 2018-02-10 ENCOUNTER — Other Ambulatory Visit: Payer: 59

## 2018-02-11 ENCOUNTER — Encounter: Payer: Self-pay | Admitting: *Deleted

## 2018-02-11 ENCOUNTER — Ambulatory Visit
Admission: RE | Admit: 2018-02-11 | Discharge: 2018-02-11 | Disposition: A | Discharge status: Home / Self Care | Payer: 59 | Attending: Obstetrics & Gynecology | Admitting: Obstetrics & Gynecology

## 2018-02-11 ENCOUNTER — Encounter
Admission: RE | Disposition: A | Discharge status: Home / Self Care | Payer: Self-pay | Source: Home / Self Care | Attending: Obstetrics & Gynecology

## 2018-02-11 ENCOUNTER — Ambulatory Visit: Payer: 59 | Admitting: Anesthesiology

## 2018-02-11 ENCOUNTER — Other Ambulatory Visit: Payer: Self-pay

## 2018-02-11 DIAGNOSIS — N920 Excessive and frequent menstruation with regular cycle: Secondary | ICD-10-CM

## 2018-02-11 DIAGNOSIS — Z8041 Family history of malignant neoplasm of ovary: Secondary | ICD-10-CM | POA: Insufficient documentation

## 2018-02-11 DIAGNOSIS — Z803 Family history of malignant neoplasm of breast: Secondary | ICD-10-CM | POA: Insufficient documentation

## 2018-02-11 DIAGNOSIS — D500 Iron deficiency anemia secondary to blood loss (chronic): Secondary | ICD-10-CM | POA: Diagnosis present

## 2018-02-11 HISTORY — PX: DILITATION & CURRETTAGE/HYSTROSCOPY WITH NOVASURE ABLATION: SHX5568

## 2018-02-11 LAB — ABO/RH: ABO/RH(D): O POS

## 2018-02-11 LAB — POCT PREGNANCY, URINE: Preg Test, Ur: NEGATIVE

## 2018-02-11 SURGERY — DILATATION & CURETTAGE/HYSTEROSCOPY WITH NOVASURE ABLATION
Anesthesia: General

## 2018-02-11 MED ORDER — LACTATED RINGERS IV SOLN
INTRAVENOUS | Status: DC
  Administered 2018-02-11: 09:00:00 via INTRAVENOUS

## 2018-02-11 MED ORDER — LACTATED RINGERS IV SOLN: INTRAVENOUS | Status: DC

## 2018-02-11 MED ORDER — LIDOCAINE HCL (CARDIAC) PF 100 MG/5ML IV SOSY
PREFILLED_SYRINGE | INTRAVENOUS | Status: DC | PRN
  Administered 2018-02-11: 80 mg via INTRAVENOUS

## 2018-02-11 MED ORDER — PROMETHAZINE HCL 25 MG/ML IJ SOLN: 6.2500 mg | INTRAMUSCULAR | Status: DC | PRN

## 2018-02-11 MED ORDER — OXYCODONE HCL 5 MG/5ML PO SOLN: 5.0000 mg | Freq: Once | ORAL | Status: DC | PRN

## 2018-02-11 MED ORDER — MEPERIDINE HCL 50 MG/ML IJ SOLN: 6.2500 mg | INTRAMUSCULAR | Status: DC | PRN

## 2018-02-11 MED ORDER — MIDAZOLAM HCL 2 MG/2ML IJ SOLN
INTRAMUSCULAR | Status: DC | PRN
  Administered 2018-02-11: 2 mg via INTRAVENOUS

## 2018-02-11 MED ORDER — OXYCODONE-ACETAMINOPHEN 5-325 MG PO TABS: 1.0000 | ORAL_TABLET | ORAL | 0 refills | Status: DC | PRN

## 2018-02-11 MED ORDER — FENTANYL CITRATE (PF) 100 MCG/2ML IJ SOLN: 25.0000 ug | INTRAMUSCULAR | Status: DC | PRN

## 2018-02-11 MED ORDER — SEVOFLURANE IN SOLN
RESPIRATORY_TRACT | Status: AC
  Filled 2018-02-11: qty 250

## 2018-02-11 MED ORDER — ONDANSETRON HCL 4 MG/2ML IJ SOLN
INTRAMUSCULAR | Status: DC | PRN
  Administered 2018-02-11: 4 mg via INTRAVENOUS

## 2018-02-11 MED ORDER — MIDAZOLAM HCL 2 MG/2ML IJ SOLN
INTRAMUSCULAR | Status: AC
  Filled 2018-02-11: qty 2

## 2018-02-11 MED ORDER — OXYCODONE-ACETAMINOPHEN 5-325 MG PO TABS: 1.0000 | ORAL_TABLET | ORAL | Status: DC | PRN

## 2018-02-11 MED ORDER — FENTANYL CITRATE (PF) 100 MCG/2ML IJ SOLN
INTRAMUSCULAR | Status: AC
  Filled 2018-02-11: qty 2

## 2018-02-11 MED ORDER — DEXAMETHASONE SODIUM PHOSPHATE 10 MG/ML IJ SOLN
INTRAMUSCULAR | Status: DC | PRN
  Administered 2018-02-11: 10 mg via INTRAVENOUS

## 2018-02-11 MED ORDER — ACETAMINOPHEN 325 MG PO TABS
ORAL_TABLET | ORAL | Status: AC
  Filled 2018-02-11: qty 1

## 2018-02-11 MED ORDER — KETOROLAC TROMETHAMINE 30 MG/ML IJ SOLN
30.0000 mg | Freq: Four times a day (QID) | INTRAMUSCULAR | Status: DC
  Filled 2018-02-11: qty 1

## 2018-02-11 MED ORDER — ACETAMINOPHEN 650 MG RE SUPP
650.0000 mg | RECTAL | Status: DC | PRN
  Filled 2018-02-11: qty 1

## 2018-02-11 MED ORDER — OXYCODONE HCL 5 MG PO TABS: 5.0000 mg | ORAL_TABLET | Freq: Once | ORAL | Status: DC | PRN

## 2018-02-11 MED ORDER — FAMOTIDINE 20 MG PO TABS
ORAL_TABLET | ORAL | Status: AC
  Filled 2018-02-11: qty 1

## 2018-02-11 MED ORDER — PROPOFOL 10 MG/ML IV BOLUS
INTRAVENOUS | Status: DC | PRN
  Administered 2018-02-11: 130 mg via INTRAVENOUS
  Administered 2018-02-11: 50 mg via INTRAVENOUS

## 2018-02-11 MED ORDER — FENTANYL CITRATE (PF) 100 MCG/2ML IJ SOLN
INTRAMUSCULAR | Status: DC | PRN
  Administered 2018-02-11: 25 ug via INTRAVENOUS

## 2018-02-11 MED ORDER — ACETAMINOPHEN 325 MG PO TABS
650.0000 mg | ORAL_TABLET | ORAL | Status: DC | PRN
  Administered 2018-02-11: 650 mg via ORAL

## 2018-02-11 MED ORDER — PROPOFOL 10 MG/ML IV BOLUS
INTRAVENOUS | Status: AC
  Filled 2018-02-11: qty 20

## 2018-02-11 MED ORDER — ACETAMINOPHEN 325 MG PO TABS: 650.0000 mg | ORAL_TABLET | Freq: Once | ORAL | Status: DC

## 2018-02-11 MED ORDER — FAMOTIDINE 20 MG PO TABS
20.0000 mg | ORAL_TABLET | Freq: Once | ORAL | Status: AC
  Administered 2018-02-11: 20 mg via ORAL

## 2018-02-11 SURGICAL SUPPLY — 17 items
CANISTER SUCT 3000ML PPV (MISCELLANEOUS) ×3 IMPLANT
CATH ROBINSON RED A/P 16FR (CATHETERS) ×3 IMPLANT
COVER WAND RF STERILE (DRAPES) ×3 IMPLANT
GLOVE BIO SURGEON STRL SZ8 (GLOVE) ×3 IMPLANT
GOWN STRL REUS W/ TWL LRG LVL3 (GOWN DISPOSABLE) ×1 IMPLANT
GOWN STRL REUS W/ TWL XL LVL3 (GOWN DISPOSABLE) ×1 IMPLANT
GOWN STRL REUS W/TWL LRG LVL3 (GOWN DISPOSABLE) ×2
GOWN STRL REUS W/TWL XL LVL3 (GOWN DISPOSABLE) ×2
HANDPIECE ABLA MINERVA ENDO (MISCELLANEOUS) ×3 IMPLANT
IV LACTATED RINGERS 1000ML (IV SOLUTION) ×3 IMPLANT
NS IRRIG 500ML POUR BTL (IV SOLUTION) ×3 IMPLANT
PACK DNC HYST (MISCELLANEOUS) ×3 IMPLANT
PAD OB MATERNITY 4.3X12.25 (PERSONAL CARE ITEMS) ×3 IMPLANT
PAD PREP 24X41 OB/GYN DISP (PERSONAL CARE ITEMS) ×3 IMPLANT
STRAP SAFETY 5IN WIDE (MISCELLANEOUS) ×3 IMPLANT
TUBING CONNECTING 10 (TUBING) ×2 IMPLANT
TUBING CONNECTING 10' (TUBING) ×1

## 2018-02-11 NOTE — Interval H&P Note (Signed)
History and Physical Interval Note:  02/11/2018 8:54 AM  Brittney Knox  has presented today for surgery, with the diagnosis of MENORRHAGIA  The various methods of treatment have been discussed with the patient and family. After consideration of risks, benefits and other options for treatment, the patient has consented to  Procedure(s): DILATATION & CURETTAGE/HYSTEROSCOPY WITH ENDOMETRIAL ABLATION - MINERVA & ACCESS TO NOVASURE (N/A) as a surgical intervention .  The patient's history has been reviewed, patient examined, no change in status, stable for surgery.  I have reviewed the patient's chart and labs.  Questions were answered to the patient's satisfaction.     Letitia Libra

## 2018-02-11 NOTE — Anesthesia Procedure Notes (Signed)
Procedure Name: LMA Insertion Date/Time: 02/11/2018 10:41 AM Performed by: Irving BurtonBachich, Jinnie Onley, CRNA Pre-anesthesia Checklist: Patient identified, Emergency Drugs available, Suction available and Patient being monitored Patient Re-evaluated:Patient Re-evaluated prior to induction Oxygen Delivery Method: Circle system utilized Preoxygenation: Pre-oxygenation with 100% oxygen Induction Type: IV induction Ventilation: Mask ventilation without difficulty LMA: LMA inserted LMA Size: 3.5 Number of attempts: 1 Placement Confirmation: positive ETCO2 and breath sounds checked- equal and bilateral Tube secured with: Tape Dental Injury: Teeth and Oropharynx as per pre-operative assessment

## 2018-02-11 NOTE — Transfer of Care (Signed)
Immediate Anesthesia Transfer of Care Note  Patient: Brittney Knox  Procedure(s) Performed: DILATATION & CURETTAGE/HYSTEROSCOPY WITH ENDOMETRIAL ABLATION - MINERVA & ACCESS TO NOVASURE (N/A )  Patient Location: PACU  Anesthesia Type:General  Level of Consciousness: awake, alert  and oriented  Airway & Oxygen Therapy: Patient connected to face mask oxygen  Post-op Assessment: Post -op Vital signs reviewed and stable  Post vital signs: stable  Last Vitals:  Vitals Value Taken Time  BP 117/67 02/11/2018 11:18 AM  Temp 36.4 C 02/11/2018 11:18 AM  Pulse 104 02/11/2018 11:19 AM  Resp 13 02/11/2018 11:19 AM  SpO2 100 % 02/11/2018 11:19 AM  Vitals shown include unvalidated device data.  Last Pain:  Vitals:   02/11/18 0814  TempSrc: Temporal  PainSc: 0-No pain         Complications: No apparent anesthesia complications

## 2018-02-11 NOTE — Anesthesia Post-op Follow-up Note (Signed)
Anesthesia QCDR form completed.        

## 2018-02-11 NOTE — Discharge Instructions (Addendum)
Hysteroscopy, Care After °This sheet gives you information about how to care for yourself after your procedure. Your health care provider may also give you more specific instructions. If you have problems or questions, contact your health care provider. °What can I expect after the procedure? °After the procedure, it is common to have: °· Cramping. °· Bleeding. This can vary from light spotting to menstrual-like bleeding. °Follow these instructions at home: °Activity °· Rest for 1-2 days after the procedure. °· Do not douche, use tampons, or have sex for 2 weeks after the procedure, or until your health care provider approves. °· Do not drive for 24 hours after the procedure, or for as long as told by your health care provider. °· Do not drive, use heavy machinery, or drink alcohol while taking prescription pain medicines. °Medicines ° °· Take over-the-counter and prescription medicines only as told by your health care provider. °· Do not take aspirin during recovery. It can increase the risk of bleeding. °General instructions °· Do not take baths, swim, or use a hot tub until your health care provider approves. Take showers instead of baths for 2 weeks, or for as long as told by your health care provider. °· To prevent or treat constipation while you are taking prescription pain medicine, your health care provider may recommend that you: °? Drink enough fluid to keep your urine clear or pale yellow. °? Take over-the-counter or prescription medicines. °? Eat foods that are high in fiber, such as fresh fruits and vegetables, whole grains, and beans. °? Limit foods that are high in fat and processed sugars, such as fried and sweet foods. °· Keep all follow-up visits as told by your health care provider. This is important. °Contact a health care provider if: °· You feel dizzy or lightheaded. °· You feel nauseous. °· You have abnormal vaginal discharge. °· You have a rash. °· You have pain that does not get better with  medicine. °· You have chills. °Get help right away if: °· You have bleeding that is heavier than a normal menstrual period. °· You have a fever. °· You have pain or cramps that get worse. °· You develop new abdominal pain. °· You faint. °· You have pain in your shoulders. °· You have shortness of breath. °Summary °· After the procedure, you may have cramping and some vaginal bleeding. °· Do not douche, use tampons, or have sex for 2 weeks after the procedure, or until your health care provider approves. °· Do not take baths, swim, or use a hot tub until your health care provider approves. Take showers instead of baths for 2 weeks, or for as long as told by your health care provider. °· Report any unusual symptoms to your health care provider. °· Keep all follow-up visits as told by your health care provider. This is important. °This information is not intended to replace advice given to you by your health care provider. Make sure you discuss any questions you have with your health care provider. °Document Released: 10/13/2012 Document Revised: 01/22/2016 Document Reviewed: 01/22/2016 °Elsevier Interactive Patient Education © 2019 [employer] ° ° °AMBULATORY SURGERY  °DISCHARGE INSTRUCTIONS ° ° °1) The drugs that you were given will stay in your system until tomorrow so for the next 24 hours you should not: ° °A) Drive an automobile °B) Make any legal decisions °C) Drink any alcoholic beverage ° ° °2) You may resume regular meals tomorrow.  Today it is better to start with liquids and   gradually work up to solid foods. ° °You may eat anything you prefer, but it is better to start with liquids, then soup and crackers, and gradually work up to solid foods. ° ° °3) Please notify your doctor immediately if you have any unusual bleeding, trouble breathing, redness and pain at the surgery site, drainage, fever, or pain not relieved by medication. ° ° ° °4) Additional Instructions: ° ° ° ° ° ° ° °Please contact your  physician with any problems or Same Day Surgery at [phone], Monday through Friday 6 am to 4 pm, or [facility] [place] [facility] number at [phone]. °

## 2018-02-11 NOTE — Anesthesia Postprocedure Evaluation (Signed)
Anesthesia Post Note  Patient: Brittney Knox  Procedure(s) Performed: DILATATION & CURETTAGE/HYSTEROSCOPY WITH ENDOMETRIAL ABLATION - MINERVA & ACCESS TO NOVASURE (N/A )  Patient location during evaluation: PACU Anesthesia Type: General Level of consciousness: awake and alert and oriented Pain management: pain level controlled Vital Signs Assessment: post-procedure vital signs reviewed and stable Respiratory status: spontaneous breathing, nonlabored ventilation and respiratory function stable Cardiovascular status: blood pressure returned to baseline and stable Postop Assessment: no signs of nausea or vomiting Anesthetic complications: no     Last Vitals:  Vitals:   02/11/18 1148 02/11/18 1203  BP: 112/71 120/71  Pulse: 73 67  Resp: 18 16  Temp: (!) 36.4 C (!) 36.4 C  SpO2: 100% 100%    Last Pain:  Vitals:   02/11/18 1203  TempSrc: Temporal  PainSc: 2                  Asaiah Scarber

## 2018-02-11 NOTE — Anesthesia Preprocedure Evaluation (Signed)
Anesthesia Evaluation  Patient identified by MRN, date of birth, ID band Patient awake    Reviewed: Allergy & Precautions, NPO status , Patient's Chart, lab work & pertinent test results  History of Anesthesia Complications Negative for: history of anesthetic complications  Airway Mallampati: I  TM Distance: >3 FB Neck ROM: Full    Dental no notable dental hx.    Pulmonary neg pulmonary ROS, neg sleep apnea, neg COPD,    breath sounds clear to auscultation- rhonchi (-) wheezing      Cardiovascular Exercise Tolerance: Good (-) hypertension(-) CAD and (-) Past MI  Rhythm:Regular Rate:Normal - Systolic murmurs and - Diastolic murmurs    Neuro/Psych negative neurological ROS  negative psych ROS   GI/Hepatic Neg liver ROS, GERD  ,  Endo/Other  negative endocrine ROSneg diabetes  Renal/GU negative Renal ROS     Musculoskeletal negative musculoskeletal ROS (+)   Abdominal (+) - obese,   Peds  Hematology  (+) anemia ,   Anesthesia Other Findings   Reproductive/Obstetrics                             Anesthesia Physical Anesthesia Plan  ASA: II  Anesthesia Plan: General   Post-op Pain Management:    Induction: Intravenous  PONV Risk Score and Plan: 2 and Ondansetron, Dexamethasone and Midazolam  Airway Management Planned: LMA  Additional Equipment:   Intra-op Plan:   Post-operative Plan:   Informed Consent: I have reviewed the patients History and Physical, chart, labs and discussed the procedure including the risks, benefits and alternatives for the proposed anesthesia with the patient or authorized representative who has indicated his/her understanding and acceptance.     Dental advisory given  Plan Discussed with: CRNA and Anesthesiologist  Anesthesia Plan Comments:         Anesthesia Quick Evaluation

## 2018-02-11 NOTE — Op Note (Signed)
Operative Note   02/11/2018  PRE-OP DIAGNOSIS: Menorrhagia   POST-OP DIAGNOSIS: same   SURGEON: Annamarie Major, MD, FACOG   PROCEDURE: Procedure(s): DILATATION & CURETTAGE/HYSTEROSCOPY WITH ENDOMETRIAL ABLATION - MINERVA  ANESTHESIA: General   ESTIMATED BLOOD LOSS: min   SPECIMENS: EMC   FLUID DEFICIT: min   COMPLICATIONS: none   DISPOSITION: PACU - hemodynamically stable.   CONDITION: stable   FINDINGS: Exam under anesthesia revealed small, mobile small uterus with no masses and bilateral adnexa without masses or fullness. Hysteroscopy revealed grossly normal appearing uterine cavity with bilateral tubal ostia and normal appearing endocervical canal.   PROCEDURE IN DETAIL: After informed consent was obtained, the patient was taken to the operating room where anesthesia was obtained without difficulty. The patient was positioned in the dorsal lithotomy position in Weiser stirrups. The patient's bladder was catheterized with an in and out foley catheter. The patient was examined under anesthesia, with the above noted findings. The weightedspeculum was placed inside the patient's vagina, and the the anterior lip of the cervix was seen and grasped with the tenaculum. An Endocervical specimen was obtained with a kevorkian curette. The uterine cavity was sounded to 9cm, and then the cervix was progressively dilated to a 16 French-Pratt dilator. The 30 degree hysteroscope was introduced, with LR fluid used to distend the intrauterine cavity, with the above noted findings.   The hystersocope was removed and the uterine cavity was curetted until a gritty texture was noted, yielding endometrial curettings. Repeat hysteroscopy performed, with improved contour and lining of uterus noted.  Excellent hemostasis was noted.   The Minerva endometrial ablation device was then placed without difficulty. Measurements were obtained. Patient was noted to have a uterine length of 9 cm, a cervical length of 3.5  cm. The device is first tested and after confirmation the procedures performed. Length of procedure was 120 seconds. The ablation device is then removed and repeat hysteroscopy reveals an appropriate lining of the uterus and no perforation or injury. Hysteroscope is removed with minimal discrepancy of fluid.   Tenaculum was removed with excellent hemostasis noted. She was then taken out of dorsal lithotomy. Minimal discrepancy in fluid was noted. No bleeding.   The patient tolerated the procedure well. Sponge, lap and needle counts were correct x2. The patient was taken to recovery room in excellent condition.  Annamarie Major, MD, [facility], [facility] 02/11/2018  11:12 AM

## 2018-02-12 ENCOUNTER — Encounter: Payer: Self-pay | Admitting: Obstetrics & Gynecology

## 2018-02-12 LAB — SURGICAL PATHOLOGY

## 2018-02-16 ENCOUNTER — Encounter: Payer: Self-pay | Admitting: Obstetrics & Gynecology

## 2018-02-17 ENCOUNTER — Encounter: Payer: Self-pay | Admitting: Obstetrics & Gynecology

## 2018-02-24 ENCOUNTER — Ambulatory Visit (INDEPENDENT_AMBULATORY_CARE_PROVIDER_SITE_OTHER): Payer: 59 | Admitting: Obstetrics & Gynecology

## 2018-02-24 ENCOUNTER — Encounter: Payer: Self-pay | Admitting: Obstetrics & Gynecology

## 2018-02-24 VITALS — BP 120/80 | Ht 64.0 in | Wt 148.0 lb

## 2018-02-24 DIAGNOSIS — N920 Excessive and frequent menstruation with regular cycle: Secondary | ICD-10-CM | POA: Diagnosis not present

## 2018-02-24 NOTE — Progress Notes (Signed)
  Follow-up Patient presents for continued management of her heavy periods.  She recently hadendometrial ablation for abnormal uterine bleeding, 2 weeks ago.  Subjective: Patient reports some improvement in her preop symptoms. She had 1 week of discharge/bleeding; no pain or odor or fever.   Objective: BP 120/80   Ht 5\' 4"  (1.626 m)   Wt 148 lb (67.1 kg)   LMP 01/30/2018 (Exact Date)   BMI 25.40 kg/m  Physical Exam Constitutional:      General: She is not in acute distress.    Appearance: She is well-developed.  Musculoskeletal: Normal range of motion.  Neurological:     Mental Status: She is alert and oriented to person, place, and time.  Skin:    General: Skin is warm and dry.  Vitals signs reviewed.   Assessment:1. Menorrhagia with regular cycle  Plan: Monitor the next couple of cycles to see response to ablation  Patient has done well after surgery with no apparent complications.  I have discussed the post-operative course to date, and the expected progress moving forward.  The patient understands what complications to be concerned about.  I will see the patient in routine follow up, or sooner if needed.    Activity plan: No restriction.  Letitia Libra 02/24/2018, 10:30 AM

## 2018-03-16 ENCOUNTER — Encounter: Payer: Self-pay | Admitting: Nurse Practitioner

## 2018-03-16 ENCOUNTER — Ambulatory Visit: Payer: Self-pay | Admitting: Nurse Practitioner

## 2018-03-16 VITALS — BP 98/67 | HR 82 | Temp 98.3°F | Ht 64.0 in | Wt 148.4 lb

## 2018-03-16 DIAGNOSIS — B373 Candidiasis of vulva and vagina: Secondary | ICD-10-CM

## 2018-03-16 DIAGNOSIS — B3731 Acute candidiasis of vulva and vagina: Secondary | ICD-10-CM | POA: Insufficient documentation

## 2018-03-16 DIAGNOSIS — N760 Acute vaginitis: Secondary | ICD-10-CM

## 2018-03-16 DIAGNOSIS — B9689 Other specified bacterial agents as the cause of diseases classified elsewhere: Secondary | ICD-10-CM

## 2018-03-16 LAB — WET PREP FOR TRICH, YEAST, CLUE
Clue Cell Exam: POSITIVE — AB
Trichomonas Exam: NEGATIVE
Yeast Exam: POSITIVE — AB

## 2018-03-16 LAB — UA/M W/RFLX CULTURE, ROUTINE
Bilirubin, UA: NEGATIVE
Glucose, UA: NEGATIVE
Ketones, UA: NEGATIVE
Leukocytes, UA: NEGATIVE
Nitrite, UA: NEGATIVE
PROTEIN UA: NEGATIVE
RBC, UA: NEGATIVE
Specific Gravity, UA: 1.02 (ref 1.005–1.030)
Urobilinogen, Ur: 0.2 mg/dL (ref 0.2–1.0)
pH, UA: 6 (ref 5.0–7.5)

## 2018-03-16 MED ORDER — METRONIDAZOLE 500 MG PO TABS: 500.0000 mg | ORAL_TABLET | Freq: Two times a day (BID) | ORAL | 0 refills | Status: DC

## 2018-03-16 MED ORDER — FLUCONAZOLE 150 MG PO TABS: 150.0000 mg | ORAL_TABLET | Freq: Once | ORAL | 0 refills | Status: AC

## 2018-03-16 NOTE — Patient Instructions (Signed)

## 2018-03-16 NOTE — Assessment & Plan Note (Signed)
+   yeast wet prep, along with clue cells.  Neg UA.  Scripts for Diflucan and Flagyl sent.  Discussed to take abx without alcohol and on full stomach.  Return for worsening or continued symptoms.

## 2018-03-16 NOTE — Assessment & Plan Note (Signed)
+   clue cells on wet prep along with yeast.  Neg UA.  Scripts for Diflucan and Flagyl sent.  Discussed to take abx without alcohol and on full stomach.  Return for worsening or continued symptoms.

## 2018-03-16 NOTE — Progress Notes (Signed)
BP 98/67 (BP Location: Left Arm, Patient Position: Sitting, Cuff Size: Normal)   Pulse 82   Temp 98.3 F (36.8 C)   Ht 5\' 4"  (1.626 m)   Wt 148 lb 7 oz (67.3 kg)   SpO2 98%   BMI 25.48 kg/m    Subjective:    Patient ID: Brittney Knox, female    DOB: 1980-02-21, 38 y.o.   MRN: [id number]  HPI: Brittney Knox is a 38 y.o. female  Chief Complaint  Patient presents with  . Abdominal Pain    abdominal pain   URINARY SYMPTOMS Symptoms started Sunday night.  Started with in abdominal area.  Did have ablation performed one month ago.  This is the time when her monthly cycle would be and she currently has had no bleeding.  Previously had menorrhagia.   Dysuria: no Urinary frequency: yes Urgency: no Small volume voids: no Symptom severity: mild Urinary incontinence: no Foul odor: no Hematuria: no Abdominal pain: yes Back pain: this morning back pain Suprapubic pain/pressure: yes Flank pain: no Fever:  no Vomiting: nausea, but no vomiting Relief with cranberry juice: no Relief with pyridium: no Status: stable Previous urinary tract infection: no Recurrent urinary tract infection: no Sexual activity: yes History of sexually transmitted disease: no Treatments attempted: none   Relevant past medical, surgical, family and social history reviewed and updated as indicated. Interim medical history since our last visit reviewed. Allergies and medications reviewed and updated.  Review of Systems  Constitutional: Negative for activity change, appetite change, diaphoresis, fatigue and fever.  Respiratory: Negative for cough, chest tightness and shortness of breath.   Cardiovascular: Negative for chest pain, palpitations and leg swelling.  Gastrointestinal: Positive for abdominal pain. Negative for abdominal distention, constipation, diarrhea, nausea and vomiting.  Endocrine: Negative for cold intolerance, heat intolerance, polydipsia, polyphagia and polyuria.  Genitourinary:  Positive for urgency. Negative for dysuria, flank pain, frequency, hematuria, pelvic pain, vaginal discharge and vaginal pain.  Neurological: Negative for dizziness, syncope, weakness, light-headedness, numbness and headaches.  Psychiatric/Behavioral: Negative.     Per HPI unless specifically indicated above     Objective:    BP 98/67 (BP Location: Left Arm, Patient Position: Sitting, Cuff Size: Normal)   Pulse 82   Temp 98.3 F (36.8 C)   Ht 5\' 4"  (1.626 m)   Wt 148 lb 7 oz (67.3 kg)   SpO2 98%   BMI 25.48 kg/m   Wt Readings from Last 3 Encounters:  03/16/18 148 lb 7 oz (67.3 kg)  02/24/18 148 lb (67.1 kg)  02/11/18 145 lb (65.8 kg)    Physical Exam Vitals signs and nursing note reviewed.  Constitutional:      General: She is awake.     Appearance: She is well-developed.  HENT:     Head: Normocephalic.  Eyes:     General:        Right eye: No discharge.        Left eye: No discharge.     Conjunctiva/sclera: Conjunctivae normal.     Pupils: Pupils are equal, round, and reactive to light.  Neck:     Musculoskeletal: Normal range of motion and neck supple.     Thyroid: No thyromegaly.     Vascular: No carotid bruit or JVD.  Cardiovascular:     Rate and Rhythm: Normal rate and regular rhythm.     Heart sounds: Normal heart sounds.  Pulmonary:     Effort: Pulmonary effort is normal.     Breath  sounds: Normal breath sounds.  Abdominal:     General: Bowel sounds are normal.     Palpations: Abdomen is soft.     Tenderness: There is no abdominal tenderness.  Genitourinary:    Comments: Self swab performed per patient request. Lymphadenopathy:     Cervical: No cervical adenopathy.  Skin:    General: Skin is warm and dry.  Neurological:     Mental Status: She is alert and oriented to person, place, and time.  Psychiatric:        Attention and Perception: Attention normal.        Mood and Affect: Mood normal.        Speech: Speech normal.        Behavior: Behavior  normal. Behavior is cooperative.        Thought Content: Thought content normal.        Judgment: Judgment normal.     Results for orders placed or performed in visit on 03/16/18  UA/M w/rflx Culture, Routine  Result Value Ref Range   Specific Gravity, UA 1.020 1.005 - 1.030   pH, UA 6.0 5.0 - 7.5   Color, UA Yellow Yellow   Appearance Ur Clear Clear   Leukocytes, UA Negative Negative   Protein, UA Negative Negative/Trace   Glucose, UA Negative Negative   Ketones, UA Negative Negative   RBC, UA Negative Negative   Bilirubin, UA Negative Negative   Urobilinogen, Ur 0.2 0.2 - 1.0 mg/dL   Nitrite, UA Negative Negative      Assessment & Plan:   Problem List Items Addressed This Visit      Genitourinary   Bacterial vaginosis - Primary    + clue cells on wet prep along with yeast.  Neg UA.  Scripts for Diflucan and Flagyl sent.  Discussed to take abx without alcohol and on full stomach.  Return for worsening or continued symptoms.      Relevant Medications   metroNIDAZOLE (FLAGYL) 500 MG tablet   fluconazole (DIFLUCAN) 150 MG tablet   Other Relevant Orders   UA/M w/rflx Culture, Routine (Completed)   WET PREP FOR TRICH, YEAST, CLUE   Yeast infection of the vagina    + yeast wet prep, along with clue cells.  Neg UA.  Scripts for Diflucan and Flagyl sent.  Discussed to take abx without alcohol and on full stomach.  Return for worsening or continued symptoms.      Relevant Medications   metroNIDAZOLE (FLAGYL) 500 MG tablet   fluconazole (DIFLUCAN) 150 MG tablet       Follow up plan: Return if symptoms worsen or fail to improve.

## 2018-03-17 ENCOUNTER — Encounter: Payer: Self-pay | Admitting: Obstetrics & Gynecology

## 2018-03-17 MED ORDER — METRONIDAZOLE 0.75 % EX GEL: 1.0000 "application " | Freq: Every day | CUTANEOUS | 0 refills | Status: AC

## 2018-03-17 NOTE — Addendum Note (Signed)
Addended by: Aura Dials T on: 03/17/2018 09:21 AM   Modules accepted: Orders

## 2018-03-22 ENCOUNTER — Telehealth: Payer: Self-pay | Admitting: Nurse Practitioner

## 2018-03-22 MED ORDER — AMOXICILLIN-POT CLAVULANATE 875-125 MG PO TABS: 1.0000 | ORAL_TABLET | Freq: Two times a day (BID) | ORAL | 0 refills | Status: AC

## 2018-03-22 NOTE — Telephone Encounter (Signed)
Reports that she ended up taking oral Flagyl for BV, but is having increased pressure (like spasms) in her bladder and her urine is a little darker.  Will send in Augmentin to take as needed only if increased UTI symptoms present.  Have advised not to take it at this time, but instead to increase fluid hydration since she had a recent GI bug and take AZO.  If continued or worsening UTI symptoms may take Augmentin.

## 2018-03-22 NOTE — Telephone Encounter (Signed)
Copied from CRM [id number]. Topic: General - Other >> Mar 22, 2018 12:48 PM Laural Benes, [place] C wrote: Reason for CRM: pt called in to be advised. Pt was seen and prescribed metroNIDAZOLE (METROGEL) 0.75 % gel . Pt says that medication is not helping. Pt would like to be advised further.

## 2018-05-24 ENCOUNTER — Encounter: Payer: Self-pay | Admitting: Obstetrics & Gynecology

## 2018-06-02 ENCOUNTER — Encounter: Payer: Self-pay | Admitting: Obstetrics & Gynecology

## 2018-06-02 ENCOUNTER — Other Ambulatory Visit: Payer: Self-pay

## 2018-06-02 ENCOUNTER — Ambulatory Visit: Payer: 59 | Admitting: Obstetrics & Gynecology

## 2018-06-02 ENCOUNTER — Ambulatory Visit (INDEPENDENT_AMBULATORY_CARE_PROVIDER_SITE_OTHER): Payer: Self-pay | Admitting: Obstetrics & Gynecology

## 2018-06-02 VITALS — Ht 64.0 in | Wt 148.0 lb

## 2018-06-02 DIAGNOSIS — F3281 Premenstrual dysphoric disorder: Secondary | ICD-10-CM

## 2018-06-02 MED ORDER — FLUOXETINE HCL (PMDD) 20 MG PO TABS: 20.0000 mg | ORAL_TABLET | Freq: Every day | ORAL | 6 refills | Status: DC

## 2018-06-02 NOTE — Progress Notes (Signed)
Virtual Visit via Telephone Note  I connected with Brittney Knox on 06/02/18 at  3:50 PM EDT by telephone and verified that I am speaking with the correct person using two identifiers.   I discussed the limitations, risks, security and privacy concerns of performing an evaluation and management service by telephone and the availability of in person appointments. I also discussed with the patient that there may be a patient responsible charge related to this service. The patient expressed understanding and agreed to proceed.  She was at home and I was in my office.  History of Present Illness: PMDD/ Premenstrual Syndrome Patient complains of menstrual symptoms.  Her periods are light since ablation yet reg. Symptoms of mood disturbance with irritability and frequent crying that are interfering w work and home functioning began 3 months ago. Patient describes symptoms of breast tenderness (mild), depression (moderate) and labile mood (moderate). Symptoms occur with periods, which are usually 28 days apart and quite regular. Patient denies menorrhagia, menstrual cramping and migraine headaches. Evaluation to date includes: none. Treatment to date includes: nothing. The patient is sexually active. Vasec for bc.   PMHx: She  has a past medical history of Anemia, BRCA negative (12/2012), Family history of breast cancer (12/2012), Family history of ovarian cancer, GERD (gastroesophageal reflux disease), and LGSIL on Pap smear of cervix. Also,  has a past surgical history that includes Cesarean section; Tonsillectomy; and Dilatation & currettage/hysteroscopy with novasure ablation (N/A, 02/11/2018)., family history includes Arthritis in her mother; Breast cancer in her cousin and maternal aunt; Breast cancer (age of onset: 35) in her maternal grandmother; Diabetes in her father and maternal grandfather; Heart disease in her father and mother; Hypothyroidism in her mother and sister; Ovarian cancer (age of onset:  63) in her paternal grandmother.,  reports that she has never smoked. She has never used smokeless tobacco. She reports current alcohol use. She reports that she does not use drugs.  She has a current medication list which includes the following prescription(s): fluoxetine hcl (pmdd). Also, is allergic to morphine and related.  Review of Systems  Constitutional: Negative for chills, fever and malaise/fatigue.  HENT: Negative for congestion, sinus pain and sore throat.   Eyes: Negative for blurred vision and pain.  Respiratory: Negative for cough and wheezing.   Cardiovascular: Negative for chest pain and leg swelling.  Gastrointestinal: Negative for abdominal pain, constipation, diarrhea, heartburn, nausea and vomiting.  Genitourinary: Negative for dysuria, frequency, hematuria and urgency.  Musculoskeletal: Negative for back pain, joint pain, myalgias and neck pain.  Skin: Negative for itching and rash.  Neurological: Negative for dizziness, tremors and weakness.  Endo/Heme/Allergies: Does not bruise/bleed easily.  Psychiatric/Behavioral: Negative for depression. The patient is not nervous/anxious and does not have insomnia.      Observations/Objective: No exam today, due to telephone eVisit due to [facility] virus restriction on elective visits and procedures.  Prior visits reviewed along with ultrasounds/labs as indicated.  Assessment and Plan: 1. PMDD (premenstrual dysphoric disorder) - counseled as to options of OCPs vs SSRIs for therapy.  As she has had ablation, do not want to start hormones at this time.   - Fluoxetine HCl, PMDD, (SARAFEM) 20 MG TABS; Take 1 tablet (20 mg total) by mouth daily. Take starting day 14 of cycle for 14 days or until symptoms resolve. Start first month by taking 10 mg (1/2 tablet) daily.  Dispense: 30 each; Refill: 6   Follow Up Instructions: Give 2-3 mos to see results   I  discussed the assessment and treatment plan with the patient. The patient was  provided an opportunity to ask questions and all were answered. The patient agreed with the plan and demonstrated an understanding of the instructions.   The patient was advised to call back or seek an in-person evaluation if the symptoms worsen or if the condition fails to improve as anticipated.  I provided 12 minutes of non-face-to-face time during this encounter.   Hoyt Koch, MD

## 2018-06-04 ENCOUNTER — Ambulatory Visit: Payer: Self-pay | Admitting: Obstetrics & Gynecology

## 2019-02-07 ENCOUNTER — Ambulatory Visit: Payer: Self-pay | Admitting: Obstetrics & Gynecology

## 2019-02-11 ENCOUNTER — Ambulatory Visit: Payer: Self-pay | Admitting: Obstetrics & Gynecology

## 2019-02-28 ENCOUNTER — Other Ambulatory Visit: Payer: Self-pay

## 2019-02-28 ENCOUNTER — Encounter: Payer: Self-pay | Admitting: Obstetrics & Gynecology

## 2019-02-28 ENCOUNTER — Ambulatory Visit (INDEPENDENT_AMBULATORY_CARE_PROVIDER_SITE_OTHER): Payer: BC Managed Care – PPO | Admitting: Obstetrics & Gynecology

## 2019-02-28 VITALS — BP 100/60 | Ht 64.0 in | Wt 152.0 lb

## 2019-02-28 DIAGNOSIS — Z13220 Encounter for screening for lipoid disorders: Secondary | ICD-10-CM

## 2019-02-28 DIAGNOSIS — Z01419 Encounter for gynecological examination (general) (routine) without abnormal findings: Secondary | ICD-10-CM

## 2019-02-28 DIAGNOSIS — F3281 Premenstrual dysphoric disorder: Secondary | ICD-10-CM | POA: Diagnosis not present

## 2019-02-28 DIAGNOSIS — Z124 Encounter for screening for malignant neoplasm of cervix: Secondary | ICD-10-CM

## 2019-02-28 DIAGNOSIS — Z131 Encounter for screening for diabetes mellitus: Secondary | ICD-10-CM

## 2019-02-28 DIAGNOSIS — Z1321 Encounter for screening for nutritional disorder: Secondary | ICD-10-CM

## 2019-02-28 DIAGNOSIS — Z1329 Encounter for screening for other suspected endocrine disorder: Secondary | ICD-10-CM

## 2019-02-28 MED ORDER — FLUOXETINE HCL (PMDD) 20 MG PO TABS: 20.0000 mg | ORAL_TABLET | Freq: Every day | ORAL | 6 refills | Status: DC

## 2019-02-28 NOTE — Patient Instructions (Addendum)
PAP every three years    Due 2022 Labs soon

## 2019-02-28 NOTE — Progress Notes (Signed)
HPI:      [name] is a 39 y.o. [phone] who LMP was No LMP recorded. Patient has had an ablation., she presents today for her annual examination. The patient has no complaints today. The patient is sexually active. Her last pap: approximate date 2019 and was normal. The patient does perform self breast exams.  There is notable family history of breast or ovarian cancer in her family.  The patient has regular exercise: yes.  The patient denies current symptoms of depression.  Ablation, 1 day period monthly w min pain, much better.  Also better sx's of PMDD w Sarafem.  GYN History: Contraception: tubal ligation  PMHx: Past Medical History:  Diagnosis Date  . Anemia   . BRCA negative 12/2012   MyRisk neg; CDKN2A VUS  . Family history of breast cancer 12/2012   IBIS=15%  . Family history of ovarian cancer   . GERD (gastroesophageal reflux disease)    OCC -NO MEDS  . LGSIL on Pap smear of cervix    Past Surgical History:  Procedure Laterality Date  . [place] WITH NOVASURE ABLATION N/A 02/11/2018   Procedure: DILATATION & CURETTAGE/HYSTEROSCOPY WITH ENDOMETRIAL ABLATION - MINERVA & ACCESS TO NOVASURE;  Surgeon: Gae Dry, MD;  Location: [facility] ORS;  Service: Gynecology;  Laterality: N/A;  . TONSILLECTOMY     Family History  Problem Relation Age of Onset  . Arthritis Mother        RA  . Heart disease Mother   . Hypothyroidism Mother   . Heart disease Father        A fib  . Diabetes Father   . Hypothyroidism Sister   . Breast cancer Maternal Grandmother 21       and 54  . Diabetes Maternal Grandfather   . Ovarian cancer Paternal Grandmother 73  . Breast cancer Cousin   . Breast cancer Maternal Aunt    Social History   Tobacco Use  . Smoking status: Never Smoker  . Smokeless tobacco: Never Used  Substance Use Topics  . Alcohol use: Yes    Comment: SOCIAL  . Drug use: No    Current Outpatient  Medications:  .  Fluoxetine HCl, PMDD, (SARAFEM) 20 MG TABS, Take 1 tablet (20 mg total) by mouth daily. Take starting day 14 of cycle for 14 days or until symptoms resolve., Disp: 30 tablet, Rfl: 6 Allergies: Morphine and related  Review of Systems  Constitutional: Negative for chills, fever and malaise/fatigue.  HENT: Negative for congestion, sinus pain and sore throat.   Eyes: Negative for blurred vision and pain.  Respiratory: Negative for cough and wheezing.   Cardiovascular: Negative for chest pain and leg swelling.  Gastrointestinal: Negative for abdominal pain, constipation, diarrhea, heartburn, nausea and vomiting.  Genitourinary: Negative for dysuria, frequency, hematuria and urgency.  Musculoskeletal: Negative for back pain, joint pain, myalgias and neck pain.  Skin: Negative for itching and rash.  Neurological: Negative for dizziness, tremors and weakness.  Endo/Heme/Allergies: Does not bruise/bleed easily.  Psychiatric/Behavioral: Negative for depression. The patient is not nervous/anxious and does not have insomnia.     Objective: BP 100/60   Ht 5' 4"  (1.626 m)   Wt 152 lb (68.9 kg)   BMI 26.09 kg/m   Filed Weights   02/28/19 1338  Weight: 152 lb (68.9 kg)   Body mass index is 26.09 kg/m. Physical Exam Constitutional:      General:  She is not in acute distress.    Appearance: She is well-developed.  Genitourinary:     Pelvic exam was performed with patient supine.     Urethra, bladder, vagina, uterus and rectum normal.     No lesions in the vagina.     No vaginal bleeding.     No cervical motion tenderness, friability, lesion or polyp.     Uterus is mobile.     Uterus is not enlarged.     No uterine mass detected.    Uterus is anteverted.     No right or left adnexal mass present.     Right adnexa not tender.     Left adnexa not tender.  HENT:     Head: Normocephalic and atraumatic. No laceration.     Right Ear: Hearing normal.     Left Ear: Hearing  normal.     Mouth/Throat:     Pharynx: Uvula midline.  Eyes:     Pupils: Pupils are equal, round, and reactive to light.  Neck:     Thyroid: No thyromegaly.  Cardiovascular:     Rate and Rhythm: Normal rate and regular rhythm.     Heart sounds: No murmur. No friction rub. No gallop.   Pulmonary:     Effort: Pulmonary effort is normal. No respiratory distress.     Breath sounds: Normal breath sounds. No wheezing.  Chest:     Breasts:        Right: No mass, skin change or tenderness.        Left: No mass, skin change or tenderness.  Abdominal:     General: Bowel sounds are normal. There is no distension.     Palpations: Abdomen is soft.     Tenderness: There is no abdominal tenderness. There is no rebound.  Musculoskeletal:        General: Normal range of motion.     Cervical back: Normal range of motion and neck supple.  Neurological:     Mental Status: She is alert and oriented to person, place, and time.     Cranial Nerves: No cranial nerve deficit.  Skin:    General: Skin is warm and dry.  Psychiatric:        Judgment: Judgment normal.  Vitals reviewed.     Assessment:  ANNUAL EXAM 1. Women's annual routine gynecological examination   2. Screening for cervical cancer   3. Screening for thyroid disorder   4. Screening for diabetes mellitus   5. Screening for cholesterol level   6. Encounter for vitamin deficiency screening   7. PMDD (premenstrual dysphoric disorder)      Screening Plan:            1.  Cervical Screening-  Pap smear schedule reviewed with patient, due 2022  2. Breast screening- Exam annually and mammogram>40 planned   3. Colonoscopy every 10 years, Hemoccult testing - after age 16  4. Labs To return fasting at a later date  5. Counseling for contraception: bilateral tubal ligation   6. PMDD (premenstrual dysphoric disorder) - Fluoxetine HCl, PMDD, (SARAFEM) 20 MG TABS; Take 1 tablet (20 mg total) by mouth daily. Take starting day 14 of cycle  for 14 days or until symptoms resolve.  Dispense: 30 tablet; Refill: 6    F/U  Return in about 1 year (around 02/28/2020) for Annual, plus am lab appt soon.  Barnett Applebaum, MD, [facility], [facility] 02/28/2019  1:59 PM

## 2019-03-01 ENCOUNTER — Other Ambulatory Visit: Payer: Self-pay

## 2019-03-01 DIAGNOSIS — F3281 Premenstrual dysphoric disorder: Secondary | ICD-10-CM

## 2019-03-01 MED ORDER — FLUOXETINE HCL (PMDD) 20 MG PO TABS: 20.0000 mg | ORAL_TABLET | Freq: Every day | ORAL | 6 refills | Status: DC

## 2019-04-15 ENCOUNTER — Other Ambulatory Visit: Payer: Self-pay

## 2019-04-15 ENCOUNTER — Ambulatory Visit (INDEPENDENT_AMBULATORY_CARE_PROVIDER_SITE_OTHER): Payer: BC Managed Care – PPO | Admitting: Nurse Practitioner

## 2019-04-15 ENCOUNTER — Encounter: Payer: Self-pay | Admitting: Nurse Practitioner

## 2019-04-15 VITALS — BP 102/67 | HR 77 | Temp 98.4°F | Ht 65.0 in | Wt 150.0 lb

## 2019-04-15 DIAGNOSIS — Z0000 Encounter for general adult medical examination without abnormal findings: Secondary | ICD-10-CM

## 2019-04-15 DIAGNOSIS — F3281 Premenstrual dysphoric disorder: Secondary | ICD-10-CM | POA: Insufficient documentation

## 2019-04-15 NOTE — Patient Instructions (Signed)
 Health Maintenance, Female Adopting a healthy lifestyle and getting preventive care are important in promoting health and wellness. Ask your health care provider about:  The right schedule for you to have regular tests and exams.  Things you can do on your own to prevent diseases and keep yourself healthy. What should I know about diet, weight, and exercise? Eat a healthy diet   Eat a diet that includes plenty of vegetables, fruits, low-fat dairy products, and lean protein.  Do not eat a lot of foods that are high in solid fats, added sugars, or sodium. Maintain a healthy weight Body mass index (BMI) is used to identify weight problems. It estimates body fat based on height and weight. Your health care provider can help determine your BMI and help you achieve or maintain a healthy weight. Get regular exercise Get regular exercise. This is one of the most important things you can do for your health. Most adults should:  Exercise for at least 150 minutes each week. The exercise should increase your heart rate and make you sweat (moderate-intensity exercise).  Do strengthening exercises at least twice a week. This is in addition to the moderate-intensity exercise.  Spend less time sitting. Even light physical activity can be beneficial. Watch cholesterol and blood lipids Have your blood tested for lipids and cholesterol at 37 years of age, then have this test every 5 years. Have your cholesterol levels checked more often if:  Your lipid or cholesterol levels are high.  You are older than 37 years of age.  You are at high risk for heart disease. What should I know about cancer screening? Depending on your health history and family history, you may need to have cancer screening at various ages. This may include screening for:  Breast cancer.  Cervical cancer.  Colorectal cancer.  Skin cancer.  Lung cancer. What should I know about heart disease, diabetes, and high blood  pressure? Blood pressure and heart disease  High blood pressure causes heart disease and increases the risk of stroke. This is more likely to develop in people who have high blood pressure readings, are of African descent, or are overweight.  Have your blood pressure checked: ? Every 3-5 years if you are 18-39 years of age. ? Every year if you are 40 years old or older. Diabetes Have regular diabetes screenings. This checks your fasting blood sugar level. Have the screening done:  Once every three years after age 40 if you are at a normal weight and have a low risk for diabetes.  More often and at a younger age if you are overweight or have a high risk for diabetes. What should I know about preventing infection? Hepatitis B If you have a higher risk for hepatitis B, you should be screened for this virus. Talk with your health care provider to find out if you are at risk for hepatitis B infection. Hepatitis C Testing is recommended for:  Everyone born from 1945 through 1965.  Anyone with known risk factors for hepatitis C. Sexually transmitted infections (STIs)  Get screened for STIs, including gonorrhea and chlamydia, if: ? You are sexually active and are younger than 37 years of age. ? You are older than 37 years of age and your health care provider tells you that you are at risk for this type of infection. ? Your sexual activity has changed since you were last screened, and you are at increased risk for chlamydia or gonorrhea. Ask your health care provider   if you are at risk.  Ask your health care provider about whether you are at high risk for HIV. Your health care provider may recommend a prescription medicine to help prevent HIV infection. If you choose to take medicine to prevent HIV, you should first get tested for HIV. You should then be tested every 3 months for as long as you are taking the medicine. Pregnancy  If you are about to stop having your period (premenopausal) and  you may become pregnant, seek counseling before you get pregnant.  Take 400 to 800 micrograms (mcg) of folic acid every day if you become pregnant.  Ask for birth control (contraception) if you want to prevent pregnancy. Osteoporosis and menopause Osteoporosis is a disease in which the bones lose minerals and strength with aging. This can result in bone fractures. If you are 65 years old or older, or if you are at risk for osteoporosis and fractures, ask your health care provider if you should:  Be screened for bone loss.  Take a calcium or vitamin D supplement to lower your risk of fractures.  Be given hormone replacement therapy (HRT) to treat symptoms of menopause. Follow these instructions at home: Lifestyle  Do not use any products that contain nicotine or tobacco, such as cigarettes, e-cigarettes, and chewing tobacco. If you need help quitting, ask your health care provider.  Do not use street drugs.  Do not share needles.  Ask your health care provider for help if you need support or information about quitting drugs. Alcohol use  Do not drink alcohol if: ? Your health care provider tells you not to drink. ? You are pregnant, may be pregnant, or are planning to become pregnant.  If you drink alcohol: ? Limit how much you use to 0-1 drink a day. ? Limit intake if you are breastfeeding.  Be aware of how much alcohol is in your drink. In the [place] one drink equals one 12 oz bottle of beer (355 mL), one 5 oz glass of wine (148 mL), or one 1 oz glass of hard liquor (44 mL). General instructions  Schedule regular health, dental, and eye exams.  Stay current with your vaccines.  Tell your health care provider if: ? You often feel depressed. ? You have ever been abused or do not feel safe at home. Summary  Adopting a healthy lifestyle and getting preventive care are important in promoting health and wellness.  Follow your health care provider's instructions about healthy  diet, exercising, and getting tested or screened for diseases.  Follow your health care provider's instructions on monitoring your cholesterol and blood pressure. This information is not intended to replace advice given to you by your health care provider. Make sure you discuss any questions you have with your health care provider. Document Revised: 12/16/2017 Document Reviewed: 12/16/2017 Elsevier Patient Education  2020 Elsevier Inc.  American Heart Association (AHA) Exercise Recommendation  Being physically active is important to prevent heart disease and stroke, the nation's No. 1and No. 5killers. To improve overall cardiovascular health, we suggest at least 150 minutes per week of moderate exercise or 75 minutes per week of vigorous exercise (or a combination of moderate and vigorous activity). Thirty minutes a day, five times a week is an easy goal to remember. You will also experience benefits even if you divide your time into two or three segments of 10 to 15 minutes per day.  For people who would benefit from lowering their blood pressure or cholesterol, we   recommend 40 minutes of aerobic exercise of moderate to vigorous intensity three to four times a week to lower the risk for heart attack and stroke.  Physical activity is anything that makes you move your body and burn calories.  This includes things like climbing stairs or playing sports. Aerobic exercises benefit your heart, and include walking, jogging, swimming or biking. Strength and stretching exercises are best for overall stamina and flexibility.  The simplest, positive change you can make to effectively improve your heart health is to start walking. It's enjoyable, free, easy, social and great exercise. A walking program is flexible and boasts high success rates because people can stick with it. It's easy for walking to become a regular and satisfying part of life.   For Overall Cardiovascular Health:  At least 30 minutes  of moderate-intensity aerobic activity at least 5 days per week for a total of 150  OR   At least 25 minutes of vigorous aerobic activity at least 3 days per week for a total of 75 minutes; or a combination of moderate- and vigorous-intensity aerobic activity  AND   Moderate- to high-intensity muscle-strengthening activity at least 2 days per week for additional health benefits.  For Lowering Blood Pressure and Cholesterol  An average 40 minutes of moderate- to vigorous-intensity aerobic activity 3 or 4 times per week  What if I can't make it to the time goal? Something is always better than nothing! And everyone has to start somewhere. Even if you've been sedentary for years, today is the day you can begin to make healthy changes in your life. If you don't think you'll make it for 30 or 40 minutes, set a reachable goal for today. You can work up toward your overall goal by increasing your time as you get stronger. Don't let all-or-nothing thinking rob you of doing what you can every day.  Source:http://www.heart.org    

## 2019-04-15 NOTE — Assessment & Plan Note (Signed)
Continue on Sarafem as ordered by Dr. Tiburcio Pea.  She reports benefit from this and improvement in overall mood.  Denies SI/HI.

## 2019-04-15 NOTE — Progress Notes (Signed)
BP 102/67   Pulse 77   Temp 98.4 F (36.9 C) (Oral)   Ht 5' 5"  (1.651 m)   Wt 150 lb (68 kg)   SpO2 98%   BMI 24.96 kg/m    Subjective:    Patient ID: Brittney Knox, female    DOB: Jun 04, 1980, 39 y.o.   MRN: [id number]  HPI: Brittney Knox is a 39 y.o. female presenting on 04/15/2019 for comprehensive medical examination. Current medical complaints include:none  She currently lives with: husband  Menopausal Symptoms: no   Overall no complaints today, she continues on Sarafem prescribed by Dr. Kenton Kingfisher for PMDD, which she reports benefit from.  Functional Status Survey: Is the patient deaf or have difficulty hearing?: No Does the patient have difficulty seeing, even when wearing glasses/contacts?: No Does the patient have difficulty concentrating, remembering, or making decisions?: No Does the patient have difficulty walking or climbing stairs?: No Does the patient have difficulty dressing or bathing?: No Does the patient have difficulty doing errands alone such as visiting a doctor's office or shopping?: No  Depression Screen done today and results listed below:  Depression screen [facility] 2/9 04/15/2019 08/06/2017 12/18/2014  Decreased Interest 0 0 0  Down, Depressed, Hopeless 0 0 0  PHQ - 2 Score 0 0 0  Altered sleeping 0 2 -  Tired, decreased energy 0 1 -  Change in appetite 0 1 -  Feeling bad or failure about yourself  0 0 -  Trouble concentrating 0 0 -  Moving slowly or fidgety/restless 0 0 -  Suicidal thoughts 0 0 -  PHQ-9 Score 0 4 -  Difficult doing work/chores Not difficult at all - -   GAD 7 : Generalized Anxiety Score 04/15/2019  Nervous, Anxious, on Edge 0  Control/stop worrying 0  Worry too much - different things 0  Trouble relaxing 0  Restless 0  Easily annoyed or irritable 0  Afraid - awful might happen 0  Total GAD 7 Score 0  Anxiety Difficulty Not difficult at all     The patient does not have a history of falls. I did not complete a risk assessment for  falls. A plan of care for falls was not documented.   Past Medical History:  Past Medical History:  Diagnosis Date  . Anemia   . BRCA negative 12/2012   MyRisk neg; CDKN2A VUS  . Family history of breast cancer 12/2012   IBIS=15%  . Family history of ovarian cancer   . GERD (gastroesophageal reflux disease)    OCC -NO MEDS  . LGSIL on Pap smear of cervix     Surgical History:  Past Surgical History:  Procedure Laterality Date  . [place] WITH NOVASURE ABLATION N/A 02/11/2018   Procedure: DILATATION & CURETTAGE/HYSTEROSCOPY WITH ENDOMETRIAL ABLATION - MINERVA & ACCESS TO NOVASURE;  Surgeon: Gae Dry, MD;  Location: [facility] ORS;  Service: Gynecology;  Laterality: N/A;  . TONSILLECTOMY      Medications:  Current Outpatient Medications on File Prior to Visit  Medication Sig  . Fluoxetine HCl, PMDD, (SARAFEM) 20 MG TABS Take 1 tablet (20 mg total) by mouth daily. Take starting day 14 of cycle for 14 days or until symptoms resolve.   No current facility-administered medications on file prior to visit.    Allergies:  Allergies  Allergen Reactions  . Morphine And Related Itching    Social History:  Social History   Socioeconomic History  .  Marital status: Married    Spouse name: Not on file  . Number of children: Not on file  . Years of education: Not on file  . Highest education level: Not on file  Occupational History  . Not on file  Tobacco Use  . Smoking status: Never Smoker  . Smokeless tobacco: Never Used  Substance and Sexual Activity  . Alcohol use: Yes    Comment: SOCIAL  . Drug use: No  . Sexual activity: Yes    Birth control/protection: None  Other Topics Concern  . Not on file  Social History Narrative  . Not on file   Social Determinants of Health   Financial Resource Strain:   . Difficulty of Paying Living Expenses:   Food Insecurity:   . Worried About Charity fundraiser in the Last  Year:   . Arboriculturist in the Last Year:   Transportation Needs:   . Film/video editor (Medical):   Marland Kitchen Lack of Transportation (Non-Medical):   Physical Activity:   . Days of Exercise per Week:   . Minutes of Exercise per Session:   Stress:   . Feeling of Stress :   Social Connections:   . Frequency of Communication with Friends and Family:   . Frequency of Social Gatherings with Friends and Family:   . Attends Religious Services:   . Active Member of Clubs or Organizations:   . Attends Archivist Meetings:   Marland Kitchen Marital Status:   Intimate Partner Violence:   . Fear of Current or Ex-Partner:   . Emotionally Abused:   Marland Kitchen Physically Abused:   . Sexually Abused:    Social History   Tobacco Use  Smoking Status Never Smoker  Smokeless Tobacco Never Used   Social History   Substance and Sexual Activity  Alcohol Use Yes   Comment: SOCIAL    Family History:  Family History  Problem Relation Age of Onset  . Arthritis Mother        RA  . Heart disease Mother   . Hypothyroidism Mother   . Heart disease Father        A fib  . Diabetes Father   . Hypothyroidism Sister   . Breast cancer Maternal Grandmother 47       and 39  . Diabetes Maternal Grandfather   . Ovarian cancer Paternal Grandmother 71  . Breast cancer Cousin   . Breast cancer Maternal Aunt     Past medical history, surgical history, medications, allergies, family history and social history reviewed with patient today and changes made to appropriate areas of the chart.   Review of Systems - negative All other ROS negative except what is listed above and in the HPI.      Objective:    BP 102/67   Pulse 77   Temp 98.4 F (36.9 C) (Oral)   Ht 5' 5"  (1.651 m)   Wt 150 lb (68 kg)   SpO2 98%   BMI 24.96 kg/m   Wt Readings from Last 3 Encounters:  04/15/19 150 lb (68 kg)  02/28/19 152 lb (68.9 kg)  06/02/18 148 lb (67.1 kg)    Physical Exam Constitutional:      General: She is awake.  She is not in acute distress.    Appearance: She is well-developed. She is not ill-appearing.  HENT:     Head: Normocephalic and atraumatic.     Right Ear: Hearing, tympanic membrane, ear canal and external ear  normal. No drainage.     Left Ear: Hearing, tympanic membrane, ear canal and external ear normal. No drainage.     Nose: Nose normal.     Right Sinus: No maxillary sinus tenderness or frontal sinus tenderness.     Left Sinus: No maxillary sinus tenderness or frontal sinus tenderness.     Mouth/Throat:     Mouth: Mucous membranes are moist.     Pharynx: Oropharynx is clear. Uvula midline. No pharyngeal swelling, oropharyngeal exudate or posterior oropharyngeal erythema.  Eyes:     General: Lids are normal.        Right eye: No discharge.        Left eye: No discharge.     Extraocular Movements: Extraocular movements intact.     Conjunctiva/sclera: Conjunctivae normal.     Pupils: Pupils are equal, round, and reactive to light.     Visual Fields: Right eye visual fields normal and left eye visual fields normal.  Neck:     Thyroid: No thyromegaly.     Vascular: No carotid bruit.     Trachea: Trachea normal.  Cardiovascular:     Rate and Rhythm: Normal rate and regular rhythm.     Heart sounds: Normal heart sounds. No murmur. No gallop.   Pulmonary:     Effort: Pulmonary effort is normal. No accessory muscle usage or respiratory distress.     Breath sounds: Normal breath sounds.  Chest:     Breasts:        Right: Normal.        Left: Normal.  Abdominal:     General: Bowel sounds are normal.     Palpations: Abdomen is soft. There is no hepatomegaly or splenomegaly.     Tenderness: There is no abdominal tenderness.  Musculoskeletal:        General: Normal range of motion.     Cervical back: Normal range of motion and neck supple.     Right lower leg: No edema.     Left lower leg: No edema.  Lymphadenopathy:     Head:     Right side of head: No submental, submandibular,  tonsillar, preauricular or posterior auricular adenopathy.     Left side of head: No submental, submandibular, tonsillar, preauricular or posterior auricular adenopathy.     Cervical: No cervical adenopathy.     Upper Body:     Right upper body: No supraclavicular, axillary or pectoral adenopathy.     Left upper body: No supraclavicular, axillary or pectoral adenopathy.  Skin:    General: Skin is warm and dry.     Capillary Refill: Capillary refill takes less than 2 seconds.     Findings: No rash.  Neurological:     Mental Status: She is alert and oriented to person, place, and time.     Cranial Nerves: Cranial nerves are intact.     Gait: Gait is intact.     Deep Tendon Reflexes: Reflexes are normal and symmetric.     Reflex Scores:      Brachioradialis reflexes are 2+ on the right side and 2+ on the left side.      Patellar reflexes are 2+ on the right side and 2+ on the left side. Psychiatric:        Attention and Perception: Attention normal.        Mood and Affect: Mood normal.        Speech: Speech normal.        Behavior: Behavior normal.  Behavior is cooperative.        Thought Content: Thought content normal.        Judgment: Judgment normal.     Results for orders placed or performed in visit on 03/16/18  WET PREP FOR [place], YEAST, CLUE   Specimen: Vaginal Fluid   VAGINAL FLUI  Result Value Ref Range   Trichomonas Exam Negative Negative   Yeast Exam Positive (A) Negative   Clue Cell Exam Positive (A) Negative  UA/M w/rflx Culture, Routine   Specimen: Urine   URINE  Result Value Ref Range   Specific Gravity, UA 1.020 1.005 - 1.030   pH, UA 6.0 5.0 - 7.5   Color, UA Yellow Yellow   Appearance Ur Clear Clear   Leukocytes, UA Negative Negative   Protein, UA Negative Negative/Trace   Glucose, UA Negative Negative   Ketones, UA Negative Negative   RBC, UA Negative Negative   Bilirubin, UA Negative Negative   Urobilinogen, Ur 0.2 0.2 - 1.0 mg/dL   Nitrite, UA  Negative Negative      Assessment & Plan:   Problem List Items Addressed This Visit      Other   PMDD (premenstrual dysphoric disorder)    Continue on Sarafem as ordered by Dr. Kenton Kingfisher.  She reports benefit from this and improvement in overall mood.  Denies SI/HI.       Other Visit Diagnoses    Healthy adult on routine physical examination    -  Primary   Relevant Orders   CBC with Differential/Platelet   Comprehensive metabolic panel   Lipid Panel w/o Chol/HDL Ratio   TSH       Follow up plan: Return in about 1 year (around 04/14/2020) for Annual physical.   LABORATORY TESTING:  - Pap smear: up to date  IMMUNIZATIONS:   - Tdap: Tetanus vaccination status reviewed: last tetanus booster within 10 years. - Influenza: Up to date - Pneumovax: Not applicable - Prevnar: Not applicable - HPV: Not applicable - Zostavax vaccine: Not applicable  SCREENING: -Mammogram: Not applicable  - Colonoscopy: Not applicable  - Bone Density: Not applicable  -Hearing Test: Not applicable  -Spirometry: Not applicable   PATIENT COUNSELING:   Advised to take 1 mg of folate supplement per day if capable of pregnancy.   Sexuality: Discussed sexually transmitted diseases, partner selection, use of condoms, avoidance of unintended pregnancy  and contraceptive alternatives.   Advised to avoid cigarette smoking.  I discussed with the patient that most people either abstain from alcohol or drink within safe limits (<=14/week and <=4 drinks/occasion for males, <=7/weeks and <= 3 drinks/occasion for females) and that the risk for alcohol disorders and other health effects rises proportionally with the number of drinks per week and how often a drinker exceeds daily limits.  Discussed cessation/primary prevention of drug use and availability of treatment for abuse.   Diet: Encouraged to adjust caloric intake to maintain  or achieve ideal body weight, to reduce intake of dietary saturated fat and  total fat, to limit sodium intake by avoiding high sodium foods and not adding table salt, and to maintain adequate dietary potassium and calcium preferably from fresh fruits, vegetables, and low-fat dairy products.    stressed the importance of regular exercise  Injury prevention: Discussed safety belts, safety helmets, smoke detector, smoking near bedding or upholstery.   Dental health: Discussed importance of regular tooth brushing, flossing, and dental visits.    NEXT PREVENTATIVE PHYSICAL DUE IN 1 YEAR. Return in  about 1 year (around 04/14/2020) for Annual physical.

## 2019-04-16 LAB — CBC WITH DIFFERENTIAL/PLATELET
Basophils Absolute: 0.1 10*3/uL (ref 0.0–0.2)
Basos: 1 %
EOS (ABSOLUTE): 0.1 10*3/uL (ref 0.0–0.4)
Eos: 3 %
Hematocrit: 44.1 % (ref 34.0–46.6)
Hemoglobin: 15 g/dL (ref 11.1–15.9)
Immature Grans (Abs): 0 10*3/uL (ref 0.0–0.1)
Immature Granulocytes: 0 %
Lymphocytes Absolute: 1 10*3/uL (ref 0.7–3.1)
Lymphs: 19 %
MCH: 34.7 pg — ABNORMAL HIGH (ref 26.6–33.0)
MCHC: 34 g/dL (ref 31.5–35.7)
MCV: 102 fL — ABNORMAL HIGH (ref 79–97)
Monocytes Absolute: 0.5 10*3/uL (ref 0.1–0.9)
Monocytes: 10 %
Neutrophils Absolute: 3.4 10*3/uL (ref 1.4–7.0)
Neutrophils: 67 %
Platelets: 122 10*3/uL — ABNORMAL LOW (ref 150–450)
RBC: 4.32 x10E6/uL (ref 3.77–5.28)
RDW: 11.7 % (ref 11.7–15.4)
WBC: 5.1 10*3/uL (ref 3.4–10.8)

## 2019-04-16 LAB — COMPREHENSIVE METABOLIC PANEL
ALT: 15 IU/L (ref 0–32)
AST: 16 IU/L (ref 0–40)
Albumin/Globulin Ratio: 2.1 (ref 1.2–2.2)
Albumin: 4.8 g/dL (ref 3.8–4.8)
Alkaline Phosphatase: 61 IU/L (ref 39–117)
BUN/Creatinine Ratio: 12 (ref 9–23)
BUN: 8 mg/dL (ref 6–20)
Bilirubin Total: 1.3 mg/dL — ABNORMAL HIGH (ref 0.0–1.2)
CO2: 25 mmol/L (ref 20–29)
Calcium: 9.6 mg/dL (ref 8.7–10.2)
Chloride: 99 mmol/L (ref 96–106)
Creatinine, Ser: 0.69 mg/dL (ref 0.57–1.00)
GFR calc Af Amer: 130 mL/min/{1.73_m2} (ref >59)
GFR calc non Af Amer: 112 mL/min/{1.73_m2} (ref >59)
Globulin, Total: 2.3 g/dL (ref 1.5–4.5)
Glucose: 81 mg/dL (ref 65–99)
Potassium: 4.6 mmol/L (ref 3.5–5.2)
Sodium: 137 mmol/L (ref 134–144)
Total Protein: 7.1 g/dL (ref 6.0–8.5)

## 2019-04-16 LAB — LIPID PANEL W/O CHOL/HDL RATIO
Cholesterol, Total: 205 mg/dL — ABNORMAL HIGH (ref 100–199)
HDL: 74 mg/dL (ref >39)
LDL Chol Calc (NIH): 117 mg/dL — ABNORMAL HIGH (ref 0–99)
Triglycerides: 78 mg/dL (ref 0–149)
VLDL Cholesterol Cal: 14 mg/dL (ref 5–40)

## 2019-04-16 LAB — TSH: TSH: 1.37 u[IU]/mL (ref 0.450–4.500)

## 2019-04-17 ENCOUNTER — Other Ambulatory Visit: Payer: Self-pay | Admitting: Nurse Practitioner

## 2019-04-17 DIAGNOSIS — D696 Thrombocytopenia, unspecified: Secondary | ICD-10-CM

## 2019-04-17 NOTE — Progress Notes (Signed)
Contacted via MyChart  Good evening Shantrell, your labs have returned.  The good news is since your GYN procedures your anemia has improved, but platelets were a little low on this check. Are you taking any aspirin at home?  I would like to recheck these on outpatient labs in 6 weeks, could you scheduled an outpatient lab visit so we can recheck?  Thyroid, kidney, and liver function look good.  Your LDL is above normal. The LDL is the bad cholesterol. Over time and in combination with inflammation and other factors, this contributes to plaque which in turn may lead to stroke and/or heart attack down the road. Sometimes high LDL is primarily genetic, and people might be eating all the right foods but still have high numbers. Other times, there is room for improvement in one's diet and eating healthier can bring this number down and potentially reduce one's risk of heart attack and/or stroke.   To reduce your LDL, Remember - more fruits and vegetables, more fish, and limit red meat and dairy products. More soy, nuts, beans, barley, lentils, oats and plant sterol ester enriched margarine instead of butter. I also encourage eliminating sugar and processed food. Remember, shop on the outside of the grocery store and visit your [employer]. If you would like to talk with me about dietary changes for your cholesterol, please let me know. We should recheck your cholesterol in 12 months.

## 2019-05-27 ENCOUNTER — Ambulatory Visit: Payer: BC Managed Care – PPO | Admitting: Nurse Practitioner

## 2019-05-27 ENCOUNTER — Telehealth: Payer: Self-pay | Admitting: Nurse Practitioner

## 2019-05-27 ENCOUNTER — Other Ambulatory Visit: Payer: BC Managed Care – PPO

## 2019-05-27 ENCOUNTER — Other Ambulatory Visit: Payer: Self-pay

## 2019-05-27 DIAGNOSIS — D696 Thrombocytopenia, unspecified: Secondary | ICD-10-CM

## 2019-05-27 DIAGNOSIS — Z111 Encounter for screening for respiratory tuberculosis: Secondary | ICD-10-CM

## 2019-05-27 NOTE — Telephone Encounter (Signed)
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

## 2019-05-27 NOTE — Telephone Encounter (Signed)
Thank you. Will sign once [place] resulted.

## 2019-05-27 NOTE — Telephone Encounter (Signed)
Paperwork placed in your folder.

## 2019-05-30 ENCOUNTER — Telehealth: Payer: Self-pay

## 2019-05-30 ENCOUNTER — Other Ambulatory Visit: Payer: Self-pay | Admitting: Nurse Practitioner

## 2019-05-30 DIAGNOSIS — D696 Thrombocytopenia, unspecified: Secondary | ICD-10-CM

## 2019-05-30 LAB — CBC WITH DIFFERENTIAL/PLATELET
Basophils Absolute: 0.1 10*3/uL (ref 0.0–0.2)
Basos: 1 %
EOS (ABSOLUTE): 0.3 10*3/uL (ref 0.0–0.4)
Eos: 5 %
Hematocrit: 43.2 % (ref 34.0–46.6)
Hemoglobin: 14.5 g/dL (ref 11.1–15.9)
Immature Grans (Abs): 0 10*3/uL (ref 0.0–0.1)
Immature Granulocytes: 0 %
Lymphocytes Absolute: 1.4 10*3/uL (ref 0.7–3.1)
Lymphs: 27 %
MCH: 34.4 pg — ABNORMAL HIGH (ref 26.6–33.0)
MCHC: 33.6 g/dL (ref 31.5–35.7)
MCV: 103 fL — ABNORMAL HIGH (ref 79–97)
Monocytes Absolute: 0.4 10*3/uL (ref 0.1–0.9)
Monocytes: 7 %
Neutrophils Absolute: 3.1 10*3/uL (ref 1.4–7.0)
Neutrophils: 60 %
Platelets: 137 10*3/uL — ABNORMAL LOW (ref 150–450)
RBC: 4.21 x10E6/uL (ref 3.77–5.28)
RDW: 11.9 % (ref 11.7–15.4)
WBC: 5.2 10*3/uL (ref 3.4–10.8)

## 2019-05-30 LAB — QUANTIFERON-TB GOLD PLUS
QuantiFERON Mitogen Value: >10 IU/mL
QuantiFERON Nil Value: 0.03 IU/mL
QuantiFERON TB1 Ag Value: 0.01 IU/mL
QuantiFERON TB2 Ag Value: 0 IU/mL
QuantiFERON-TB Gold Plus: NEGATIVE

## 2019-05-30 NOTE — Progress Notes (Signed)
Contacted via MyChartGood afternoon Brittney Knox.  I have filled out your forms, your TB test was negative.  On CBC your platelets are still a little on low side, but improving.  I noticed back in 2017 you had similar.  I would like to recheck this in a few months.  Can you schedule lab visit only for 3 months for recheck.  Thank you.  Let me know you received this message. Keep being awesome!! Kindest regards, Natanael Saladin

## 2019-05-30 NOTE — Telephone Encounter (Signed)
Paperwork completed and ready for patient to pick up. Called and left her a voicemail letting her know that it was ready for her.

## 2019-08-31 ENCOUNTER — Other Ambulatory Visit: Payer: Self-pay

## 2019-08-31 ENCOUNTER — Other Ambulatory Visit: Payer: BC Managed Care – PPO

## 2019-08-31 DIAGNOSIS — D696 Thrombocytopenia, unspecified: Secondary | ICD-10-CM

## 2019-09-01 LAB — CBC WITH DIFFERENTIAL/PLATELET
Basophils Absolute: 0.1 10*3/uL (ref 0.0–0.2)
Basos: 1 %
EOS (ABSOLUTE): 0.1 10*3/uL (ref 0.0–0.4)
Eos: 2 %
Hematocrit: 41.9 % (ref 34.0–46.6)
Hemoglobin: 14.2 g/dL (ref 11.1–15.9)
Immature Grans (Abs): 0 10*3/uL (ref 0.0–0.1)
Immature Granulocytes: 0 %
Lymphocytes Absolute: 1.4 10*3/uL (ref 0.7–3.1)
Lymphs: 27 %
MCH: 35.1 pg — ABNORMAL HIGH (ref 26.6–33.0)
MCHC: 33.9 g/dL (ref 31.5–35.7)
MCV: 104 fL — ABNORMAL HIGH (ref 79–97)
Monocytes Absolute: 0.4 10*3/uL (ref 0.1–0.9)
Monocytes: 8 %
Neutrophils Absolute: 3 10*3/uL (ref 1.4–7.0)
Neutrophils: 62 %
Platelets: 120 10*3/uL — ABNORMAL LOW (ref 150–450)
RBC: 4.04 x10E6/uL (ref 3.77–5.28)
RDW: 11.9 % (ref 11.7–15.4)
WBC: 4.9 10*3/uL (ref 3.4–10.8)

## 2019-09-01 LAB — IRON,TIBC AND FERRITIN PANEL
Ferritin: 71 ng/mL (ref 15–150)
Iron Saturation: 25 % (ref 15–55)
Iron: 76 ug/dL (ref 27–159)
Total Iron Binding Capacity: 310 ug/dL (ref 250–450)
UIBC: 234 ug/dL (ref 131–425)

## 2019-09-01 NOTE — Progress Notes (Signed)
Contacted via MyChart  Good afternoon Artis, your labs have returned.  Iron level is good as is ferritin, continue your supplements daily.  Anemia has improved.  Platelet level continues on lower side at 120, 4 years ago it was 123 and it has kind of fluctuated over the years on review.  I would avoid an Aspirin products.  We can recheck this in a few months with a visit, since this is considered mild thrombocytopenia (mild low platelets) and if still low send to hematology or we can send to hematology now.  I feel at this time we can monitor and send in future if needed, but wanted to see your thoughts.  Any questions? Keep being awesome!!  Thank you for allowing me to participate in your care. Kindest regards, Avi Kerschner

## 2019-09-05 ENCOUNTER — Encounter: Payer: Self-pay | Admitting: Family Medicine

## 2019-09-05 ENCOUNTER — Telehealth (INDEPENDENT_AMBULATORY_CARE_PROVIDER_SITE_OTHER): Payer: BC Managed Care – PPO | Admitting: Family Medicine

## 2019-09-05 DIAGNOSIS — J069 Acute upper respiratory infection, unspecified: Secondary | ICD-10-CM

## 2019-09-05 MED ORDER — ALBUTEROL SULFATE HFA 108 (90 BASE) MCG/ACT IN AERS: 2.0000 | INHALATION_SPRAY | Freq: Four times a day (QID) | RESPIRATORY_TRACT | 0 refills | Status: DC | PRN

## 2019-09-05 MED ORDER — PREDNISONE 50 MG PO TABS: 50.0000 mg | ORAL_TABLET | Freq: Every day | ORAL | 0 refills | Status: DC

## 2019-09-05 NOTE — Progress Notes (Signed)
There were no vitals taken for this visit.   Subjective:    Patient ID: Brittney Knox, female    DOB: 1980/05/24, 39 y.o.   MRN: [id number]  HPI: Brittney Knox is a 39 y.o. female  Chief Complaint  Patient presents with  . Cough  . Nasal Congestion  . Sinusitis   UPPER RESPIRATORY TRACT INFECTION- tested negative for COVID on Sunday Duration: 3-4 days  Worst symptom: congestion in chest Fever: no Cough: yes Shortness of breath: no Wheezing: no Chest pain: no Chest tightness: yes Chest congestion: yes Nasal congestion: yes Runny nose: yes Post nasal drip: yes Sneezing: no Sore throat: no Swollen glands: no Sinus pressure: yes Headache: no Face pain: no Toothache: no Ear pain: yes, bilaterally Ear pressure: no  Eyes red/itching:no Eye drainage/crusting: no  Vomiting: no Rash: no Fatigue: yes Sick contacts: no Strep contacts: no  Context: stable Recurrent sinusitis: no Relief with OTC cold/cough medications: yes  Treatments attempted: Allegra-D, tylenol   Relevant past medical, surgical, family and social history reviewed and updated as indicated. Interim medical history since our last visit reviewed. Allergies and medications reviewed and updated.  Review of Systems  Constitutional: Positive for fatigue. Negative for activity change, appetite change, chills, diaphoresis, fever and unexpected weight change.  HENT: Positive for congestion, postnasal drip, rhinorrhea and sinus pressure. Negative for dental problem, drooling, ear discharge, ear pain, facial swelling, hearing loss, mouth sores, nosebleeds, sinus pain, sneezing, sore throat, tinnitus, trouble swallowing and voice change.   Eyes: Negative.   Respiratory: Positive for cough, chest tightness and shortness of breath. Negative for apnea, choking, wheezing and stridor.   Cardiovascular: Negative.   Gastrointestinal: Negative.   Musculoskeletal: Positive for myalgias. Negative for arthralgias, back  pain, gait problem, joint swelling, neck pain and neck stiffness.  Skin: Negative.   Psychiatric/Behavioral: Negative.     Per HPI unless specifically indicated above     Objective:    There were no vitals taken for this visit.  Wt Readings from Last 3 Encounters:  04/15/19 150 lb (68 kg)  02/28/19 152 lb (68.9 kg)  06/02/18 148 lb (67.1 kg)    Physical Exam Vitals and nursing note reviewed.  Constitutional:      General: She is not in acute distress.    Appearance: Normal appearance. She is not ill-appearing, toxic-appearing or diaphoretic.  HENT:     Head: Normocephalic and atraumatic.     Right Ear: External ear normal.     Left Ear: External ear normal.     Nose: Nose normal.     Mouth/Throat:     Mouth: Mucous membranes are moist.     Pharynx: Oropharynx is clear.  Eyes:     General: No scleral icterus.       Right eye: No discharge.        Left eye: No discharge.     Conjunctiva/sclera: Conjunctivae normal.     Pupils: Pupils are equal, round, and reactive to light.  Pulmonary:     Effort: Pulmonary effort is normal. No respiratory distress.     Comments: Speaking in full sentences Musculoskeletal:        General: Normal range of motion.     Cervical back: Normal range of motion.  Skin:    Coloration: Skin is not jaundiced or pale.     Findings: No bruising, erythema, lesion or rash.  Neurological:     Mental Status: She is alert and oriented to person, place, and time. Mental  status is at baseline.  Psychiatric:        Mood and Affect: Mood normal.        Behavior: Behavior normal.        Thought Content: Thought content normal.        Judgment: Judgment normal.     Results for orders placed or performed in visit on 08/31/19  Iron, TIBC and Ferritin Panel  Result Value Ref Range   Total Iron Binding Capacity 310 250 - 450 ug/dL   UIBC 295 621 - 308 ug/dL   Iron 76 27 - 657 ug/dL   Iron Saturation 25 15 - 55 %   Ferritin 71 15.0 - 150.0 ng/mL  CBC  with Differential/Platelet  Result Value Ref Range   WBC 4.9 3.4 - 10.8 x10E3/uL   RBC 4.04 3.77 - 5.28 x10E6/uL   Hemoglobin 14.2 11.1 - 15.9 g/dL   Hematocrit 84.6 96.2 - 46.6 %   MCV 104 (H) 79 - 97 fL   MCH 35.1 (H) 26.6 - 33.0 pg   MCHC 33.9 31 - 35 g/dL   RDW 95.2 84.1 - 32.4 %   Platelets 120 (L) 150 - 450 x10E3/uL   Neutrophils 62 Not Estab. %   Lymphs 27 Not Estab. %   Monocytes 8 Not Estab. %   Eos 2 Not Estab. %   Basos 1 Not Estab. %   Neutrophils Absolute 3.0 1 - 7 x10E3/uL   Lymphocytes Absolute 1.4 0 - 3 x10E3/uL   Monocytes Absolute 0.4 0 - 0 x10E3/uL   EOS (ABSOLUTE) 0.1 0.0 - 0.4 x10E3/uL   Basophils Absolute 0.1 0 - 0 x10E3/uL   Immature Granulocytes 0 Not Estab. %   Immature Grans (Abs) 0.0 0.0 - 0.1 x10E3/uL      Assessment & Plan:   Problem List Items Addressed This Visit    None    Visit Diagnoses    Upper respiratory tract infection, unspecified type    -  Primary   Will treat with prednisone and albuterol. Call if not better by Wednesday and we'll add abx. Call with any any concerns.        Follow up plan: Return if symptoms worsen or fail to improve.    . This visit was completed via MyChart due to the restrictions of the COVID-19 pandemic. All issues as above were discussed and addressed. Physical exam was done as above through visual confirmation on MyChart. If it was felt that the patient should be evaluated in the office, they were directed there. The patient verbally consented to this visit. . Location of the patient: home . Location of the provider: work . Those involved with this call:  . Provider: Olevia Perches, DO . CMA: Floydene Flock, RMA . Front Desk/Registration: Adela Ports  . Time spent on call: 15 minutes with patient face to face via video conference. More than 50% of this time was spent in counseling and coordination of care. 23 minutes total spent in review of patient's record and preparation of their chart.

## 2019-09-09 ENCOUNTER — Ambulatory Visit: Payer: Self-pay | Admitting: *Deleted

## 2019-09-09 ENCOUNTER — Other Ambulatory Visit: Payer: Self-pay | Admitting: Family Medicine

## 2019-09-09 ENCOUNTER — Encounter: Payer: Self-pay | Admitting: Family Medicine

## 2019-09-09 MED ORDER — DOXYCYCLINE HYCLATE 100 MG PO TABS: 100.0000 mg | ORAL_TABLET | Freq: Two times a day (BID) | ORAL | 0 refills | Status: DC

## 2019-09-09 MED ORDER — DOXYCYCLINE HYCLATE 100 MG PO TABS
100.0000 mg | ORAL_TABLET | Freq: Two times a day (BID) | ORAL | 0 refills | Status: DC
Start: 2019-09-09 — End: 2020-05-09

## 2019-09-09 NOTE — Telephone Encounter (Signed)
Summary: Clinical Advice   Patient was seen 09/05/2019 by Dr. Laural Benes from [facility], PCP advised patient to follow up if antibiotics did not work, patient states symptoms have not improved and would like to know the next plan of action, please advise      Attempted to call patinet- left message to return call.

## 2019-09-09 NOTE — Telephone Encounter (Signed)
Patient messaged in requesting medication to be sent to the local pharmacy, [facility] in [place].

## 2019-09-09 NOTE — Telephone Encounter (Signed)
Pt notified, verbalized understanding.

## 2019-09-09 NOTE — Telephone Encounter (Signed)
Pt. Reports she is "still feeling the same." Head and chest congestion "is no better. Dr. Laural Knox said she might have to put me on an antibiotic if I wasn't any better." Please advise pt.

## 2019-09-09 NOTE — Telephone Encounter (Signed)
Doxycycline sent in. Take with food and let me know if she's not doing better by next week

## 2019-09-23 ENCOUNTER — Other Ambulatory Visit: Payer: Self-pay | Admitting: Family Medicine

## 2019-09-23 NOTE — Telephone Encounter (Signed)
Requested  medications are  due for refill today yes  Requested medications are on the active medication list yes  Last refill 8/30  Last visit 8/30  Future visit scheduled No  Notes to clinic Unclear if this was to be continued, was given for a respiratory illness.

## 2019-09-23 NOTE — Telephone Encounter (Signed)
Routing to provider  

## 2020-03-12 ENCOUNTER — Ambulatory Visit: Payer: Self-pay | Admitting: Nurse Practitioner

## 2020-05-09 ENCOUNTER — Ambulatory Visit
Admission: RE | Admit: 2020-05-09 | Discharge: 2020-05-09 | Disposition: A | Discharge status: Home / Self Care | Payer: Self-pay | Source: Home / Self Care | Attending: Nurse Practitioner | Admitting: Nurse Practitioner

## 2020-05-09 ENCOUNTER — Encounter: Payer: Self-pay | Admitting: Nurse Practitioner

## 2020-05-09 ENCOUNTER — Ambulatory Visit
Admission: RE | Admit: 2020-05-09 | Discharge: 2020-05-09 | Disposition: A | Discharge status: Home / Self Care | Payer: Self-pay | Source: Ambulatory Visit | Attending: Nurse Practitioner | Admitting: Nurse Practitioner

## 2020-05-09 ENCOUNTER — Other Ambulatory Visit: Payer: Self-pay

## 2020-05-09 ENCOUNTER — Ambulatory Visit (INDEPENDENT_AMBULATORY_CARE_PROVIDER_SITE_OTHER): Payer: Self-pay | Admitting: Nurse Practitioner

## 2020-05-09 VITALS — BP 108/71 | HR 93 | Temp 97.3°F | Wt 160.4 lb

## 2020-05-09 DIAGNOSIS — M545 Low back pain, unspecified: Secondary | ICD-10-CM

## 2020-05-09 DIAGNOSIS — D72829 Elevated white blood cell count, unspecified: Secondary | ICD-10-CM

## 2020-05-09 MED ORDER — CYCLOBENZAPRINE HCL 10 MG PO TABS: 10.0000 mg | ORAL_TABLET | Freq: Three times a day (TID) | ORAL | 0 refills | Status: DC | PRN

## 2020-05-09 NOTE — Progress Notes (Signed)
BP 108/71   Pulse 93   Temp (!) 97.3 F (36.3 C)   Wt 160 lb 6 oz (72.7 kg)   SpO2 98%   BMI 26.69 kg/m    Subjective:    Patient ID: Brittney Knox, female    DOB: 09/02/1980, 40 y.o.   MRN: [id number]  HPI: Brittney Knox is a 40 y.o. female  Chief Complaint  Patient presents with  . Abdominal Pain  . Back Pain   Patient presents to clinic today with back pain that has been going on for about 3 weeks.  Patient states it hurts when she leans back too far or too far forward. Patient states it doesn't feel like a muscle.  Patient has been taking tylenol and ibuprofen and it hasn't improve.  Ice and heat have also not improved the symptoms.  Patient can not bend down to touch her toes. Laying flat is an okay position for her.  Patient denies radiating pain.  Denies injury.   Patient dipped her urine and saw that she had leukocytes and nitrites.  She was treated with Macrobid but does not feel like symptoms have resolved. Patient is having pain with urination.   Relevant past medical, surgical, family and social history reviewed and updated as indicated. Interim medical history since our last visit reviewed. Allergies and medications reviewed and updated.  Review of Systems  Genitourinary: Positive for dysuria.  Musculoskeletal: Positive for back pain.    Per HPI unless specifically indicated above     Objective:    BP 108/71   Pulse 93   Temp (!) 97.3 F (36.3 C)   Wt 160 lb 6 oz (72.7 kg)   SpO2 98%   BMI 26.69 kg/m   Wt Readings from Last 3 Encounters:  05/09/20 160 lb 6 oz (72.7 kg)  04/15/19 150 lb (68 kg)  02/28/19 152 lb (68.9 kg)    Physical Exam Vitals and nursing note reviewed.  Constitutional:      General: She is not in acute distress.    Appearance: Normal appearance. She is normal weight. She is not ill-appearing, toxic-appearing or diaphoretic.  HENT:     Head: Normocephalic.     Right Ear: External ear normal.     Left Ear: External ear  normal.     Nose: Nose normal.     Mouth/Throat:     Mouth: Mucous membranes are moist.     Pharynx: Oropharynx is clear.  Eyes:     General:        Right eye: No discharge.        Left eye: No discharge.     Extraocular Movements: Extraocular movements intact.     Conjunctiva/sclera: Conjunctivae normal.     Pupils: Pupils are equal, round, and reactive to light.  Cardiovascular:     Rate and Rhythm: Normal rate and regular rhythm.     Heart sounds: No murmur heard.   Pulmonary:     Effort: Pulmonary effort is normal. No respiratory distress.     Breath sounds: Normal breath sounds. No wheezing or rales.  Musculoskeletal:     Cervical back: Normal range of motion and neck supple.     Lumbar back: Spasms present. No swelling, edema, deformity, tenderness or bony tenderness. Decreased range of motion.  Skin:    General: Skin is warm and dry.     Capillary Refill: Capillary refill takes less than 2 seconds.  Neurological:     General: No focal deficit  present.     Mental Status: She is alert and oriented to person, place, and time. Mental status is at baseline.  Psychiatric:        Mood and Affect: Mood normal.        Behavior: Behavior normal.        Thought Content: Thought content normal.        Judgment: Judgment normal.     Results for orders placed or performed in visit on 08/31/19  Iron, TIBC and Ferritin Panel  Result Value Ref Range   Total Iron Binding Capacity 310 250 - 450 ug/dL   UIBC 387 564 - 332 ug/dL   Iron 76 27 - 951 ug/dL   Iron Saturation 25 15 - 55 %   Ferritin 71 15 - 150 ng/mL  CBC with Differential/Platelet  Result Value Ref Range   WBC 4.9 3.4 - 10.8 x10E3/uL   RBC 4.04 3.77 - 5.28 x10E6/uL   Hemoglobin 14.2 11.1 - 15.9 g/dL   Hematocrit 88.4 16.6 - 46.6 %   MCV 104 (H) 79 - 97 fL   MCH 35.1 (H) 26.6 - 33.0 pg   MCHC 33.9 31.5 - 35.7 g/dL   RDW 06.3 01.6 - 01.0 %   Platelets 120 (L) 150 - 450 x10E3/uL   Neutrophils 62 Not Estab. %    Lymphs 27 Not Estab. %   Monocytes 8 Not Estab. %   Eos 2 Not Estab. %   Basos 1 Not Estab. %   Neutrophils Absolute 3.0 1.4 - 7.0 x10E3/uL   Lymphocytes Absolute 1.4 0.7 - 3.1 x10E3/uL   Monocytes Absolute 0.4 0.1 - 0.9 x10E3/uL   EOS (ABSOLUTE) 0.1 0.0 - 0.4 x10E3/uL   Basophils Absolute 0.1 0.0 - 0.2 x10E3/uL   Immature Granulocytes 0 Not Estab. %   Immature Grans (Abs) 0.0 0.0 - 0.1 x10E3/uL      Assessment & Plan:   Problem List Items Addressed This Visit   None   Visit Diagnoses    Leukocytosis, unspecified type    -  Primary   Urine sent for culture.  Will send antibiotic based on culture results.    Relevant Orders   Urine Culture   Acute bilateral low back pain without sciatica       Obtain xray of back. Can use ibuprofen 800mg  TID and flexeril sent to the pharmacy.  Will make further recommendations based on imaging results.    Relevant Medications   cyclobenzaprine (FLEXERIL) 10 MG tablet   Other Relevant Orders   DG Lumbar Spine Complete       Follow up plan: Return if symptoms worsen or fail to improve.

## 2020-05-11 LAB — URINE CULTURE

## 2020-05-11 NOTE — Progress Notes (Signed)
Hi Kalya. Just wanted to let you know that your urine did not show an infection.  Continue with the plan as discussed.  Follow up if your symptoms do not improve.

## 2020-05-14 NOTE — Progress Notes (Signed)
Hi Brittney Knox. Your xray didn't show any acute findings.  However, you do have some scoliosis.  I think at this point, we should see Orthopedics since you are still having significant pain and we aren't able to determine the cause.  I can place the referral for you.  Please let me know if you have any other questions.

## 2020-07-06 ENCOUNTER — Other Ambulatory Visit: Payer: Self-pay | Admitting: Obstetrics and Gynecology

## 2020-07-06 ENCOUNTER — Telehealth: Payer: Self-pay

## 2020-07-06 DIAGNOSIS — F3281 Premenstrual dysphoric disorder: Secondary | ICD-10-CM

## 2020-07-06 MED ORDER — FLUOXETINE HCL (PMDD) 20 MG PO TABS: 20.0000 mg | ORAL_TABLET | Freq: Every day | ORAL | 0 refills | Status: DC

## 2020-07-06 NOTE — Telephone Encounter (Signed)
Pt calling; prozac rx is out of date; her Dad is being moved to Hospice; she is very upset about it and crying a lot; could she please have an emergency supply to get her through the next few days until she can schedule an appointment.  She contacted her PCP and they referred her back to [place] since [facility] was the original prescriber.  [phone]

## 2020-07-06 NOTE — Telephone Encounter (Signed)
Rx RF eRxd to [facility]. Pls notify pt. Thx

## 2020-07-06 NOTE — Progress Notes (Signed)
Rx RF fluoxetine until can sched annual.

## 2020-07-09 ENCOUNTER — Encounter: Payer: Self-pay | Admitting: Nurse Practitioner

## 2020-07-09 DIAGNOSIS — D696 Thrombocytopenia, unspecified: Secondary | ICD-10-CM | POA: Insufficient documentation

## 2020-07-09 DIAGNOSIS — E7800 Pure hypercholesterolemia, unspecified: Secondary | ICD-10-CM | POA: Insufficient documentation

## 2020-07-10 ENCOUNTER — Ambulatory Visit: Payer: Self-pay | Admitting: Nurse Practitioner

## 2020-07-10 NOTE — Telephone Encounter (Signed)
Left msg rx has been refilled; sent to [facility].

## 2020-09-12 ENCOUNTER — Ambulatory Visit: Payer: Self-pay

## 2020-09-12 ENCOUNTER — Telehealth: Payer: Medicaid Other | Admitting: Physician Assistant

## 2020-09-12 ENCOUNTER — Telehealth: Payer: Medicaid Other | Admitting: Family Medicine

## 2020-09-12 DIAGNOSIS — U071 COVID-19: Secondary | ICD-10-CM

## 2020-09-12 MED ORDER — BENZONATATE 100 MG PO CAPS: 100.0000 mg | ORAL_CAPSULE | Freq: Three times a day (TID) | ORAL | 0 refills | Status: DC | PRN

## 2020-09-12 MED ORDER — ALBUTEROL SULFATE HFA 108 (90 BASE) MCG/ACT IN AERS: 2.0000 | INHALATION_SPRAY | Freq: Four times a day (QID) | RESPIRATORY_TRACT | 0 refills | Status: DC | PRN

## 2020-09-12 NOTE — Patient Instructions (Signed)
  Brittney Knox, thank you for joining [name], PA-C for today's virtual visit.  While this provider is not your primary care provider (PCP), if your PCP is located in our provider database this encounter information will be shared with them immediately following your visit.  Consent: (Patient) Brittney Knox provided verbal consent for this virtual visit at the beginning of the encounter.  Current Medications:  Current Outpatient Medications:    albuterol (VENTOLIN HFA) 108 (90 Base) MCG/ACT inhaler, Inhale 2 puffs into the lungs every 4 (four) hours as needed for wheezing or shortness of breath., Disp: 6.7 each, Rfl: 2   cyclobenzaprine (FLEXERIL) 10 MG tablet, Take 1 tablet (10 mg total) by mouth 3 (three) times daily as needed for muscle spasms., Disp: 30 tablet, Rfl: 0   Fluoxetine HCl, PMDD, (SARAFEM) 20 MG TABS, Take 1 tablet (20 mg total) by mouth daily. Take starting day 14 of cycle for 14 days or until symptoms resolve., Disp: 30 tablet, Rfl: 0   Medications ordered in this encounter:  No orders of the defined types were placed in this encounter.    *If you need refills on other medications prior to your next appointment, please contact your pharmacy*  Follow-Up: Call back or seek an in-person evaluation if the symptoms worsen or if the condition fails to improve as anticipated.  Other Instructions Please keep well-hydrated and get plenty of rest. Start a saline nasal rinse to flush out your nasal passages. You can use plain Mucinex to help thin congestion. If you have a humidifier, running in the bedroom at night. I want you to start OTC vitamin D3 1000 units daily, vitamin C 1000 mg daily, and a zinc supplement. Please take prescribed medications as directed.  You have been enrolled in a MyChart symptom monitoring program. Please answer these questions daily so we can keep track of how you are doing.  You were to quarantine for 5 days from onset of your  symptoms.  After day 5, if you have had no fever and you are feeling better, you can end quarantine but need to mask for an additional 5 days. After day 5 if you have a fever or are having significant symptoms, please quarantine for full 10 days.  If you note any worsening of symptoms, any significant shortness of breath or any chest pain, please seek ER evaluation ASAP.  Please do not delay care!   If you have been instructed to have an in-person evaluation today at a local Urgent Care facility, please use the link below. It will take you to a list of all of our available [facility], including address, phone number and hours of operation. Please do not delay care.  [facility]  If you or a family member do not have a primary care provider, use the link below to schedule a visit and establish care. When you choose a [facility] primary care physician or advanced practice provider, you gain a long-term partner in health. Find a Primary Care Provider  Learn more about [facility]'s in-office and virtual care options:  - Get Care Now

## 2020-09-12 NOTE — Telephone Encounter (Signed)
Routing to provider as an FYI

## 2020-09-12 NOTE — Progress Notes (Signed)
Virtual Visit Consent   Gini Caputo, you are scheduled for a virtual visit with a [facility] provider today.     Just as with appointments in the office, your consent must be obtained to participate.  Your consent will be active for this visit and any virtual visit you may have with one of our providers in the next 365 days.     If you have a MyChart account, a copy of this consent can be sent to you electronically.  All virtual visits are billed to your insurance company just like a traditional visit in the office.    As this is a virtual visit, video technology does not allow for your provider to perform a traditional examination.  This may limit your provider's ability to fully assess your condition.  If your provider identifies any concerns that need to be evaluated in person or the need to arrange testing (such as labs, EKG, etc.), we will make arrangements to do so.     Although advances in technology are sophisticated, we cannot ensure that it will always work on either your end or our end.  If the connection with a video visit is poor, the visit may have to be switched to a telephone visit.  With either a video or telephone visit, we are not always able to ensure that we have a secure connection.     I need to obtain your verbal consent now.   Are you willing to proceed with your visit today?    Brittney Knox has provided verbal consent on 09/12/2020 for a virtual visit (video or telephone).   [name], [place]   Date: 09/12/2020 9:24 AM   Virtual Visit via Video Note   I, [name], connected with  Brittney Knox  ([id number], [birth year]) on 09/12/20 at  9:15 AM EDT by a video-enabled telemedicine application and verified that I am speaking with the correct person using two identifiers.  Location: Patient: Virtual Visit Location Patient: Home Provider: Virtual Visit Location Provider: Home Office   I discussed the limitations of evaluation and management by  telemedicine and the availability of in person appointments. The patient expressed understanding and agreed to proceed.    History of Present Illness: Brittney Knox is a 40 y.o. who identifies as a female who was assigned female at birth, and is being seen today for COVID-19. Notes symptoms starting 4 days ago with nasal/head congestion, chest congestion and cough productive of clear sputum, chest tightness and fatigue. Tested Positive 2 days ago (Monday). Notes some chest wall tenderness with coughing but denies any overt chest pain or true SOB. Has not been using her albuterol inhaler as is almost out and old. Needs refill.  HPI: HPI  Problems:  Patient Active Problem List   Diagnosis Date Noted   Thrombocytopenia (HCC) 07/09/2020   Elevated low density lipoprotein (LDL) cholesterol level 07/09/2020   PMDD (premenstrual dysphoric disorder) 04/15/2019   H/O varicose veins of lower extremity 08/05/2017   Allergic rhinitis 12/18/2014    Allergies:  Allergies  Allergen Reactions   Morphine And Related Itching   Medications:  Current Outpatient Medications:    albuterol (VENTOLIN HFA) 108 (90 Base) MCG/ACT inhaler, Inhale 2 puffs into the lungs every 6 (six) hours as needed for wheezing or shortness of breath., Disp: 8 g, Rfl: 0   benzonatate (TESSALON) 100 MG capsule, Take 1 capsule (100 mg total) by mouth 3 (three) times daily as needed for cough., Disp: 30 capsule,  Rfl: 0   Fluoxetine HCl, PMDD, (SARAFEM) 20 MG TABS, Take 1 tablet (20 mg total) by mouth daily. Take starting day 14 of cycle for 14 days or until symptoms resolve., Disp: 30 tablet, Rfl: 0  Observations/Objective: Patient is well-developed, well-nourished in no acute distress.  Resting comfortably at home.  Head is normocephalic, atraumatic.  No labored breathing. Speech is clear and coherent with logical content.  Patient is alert and oriented at baseline.   Assessment and Plan: 1. COVID-19 - albuterol (VENTOLIN  HFA) 108 (90 Base) MCG/ACT inhaler; Inhale 2 puffs into the lungs every 6 (six) hours as needed for wheezing or shortness of breath.  Dispense: 8 g; Refill: 0 - benzonatate (TESSALON) 100 MG capsule; Take 1 capsule (100 mg total) by mouth 3 (three) times daily as needed for cough.  Dispense: 30 capsule; Refill: 0 Milder symptoms. Chest wall tenderness for coughing 2/2 bronchospasm. Albuterol refilled. Supportive measures, OTC medications and Vitamin regimen reviewed. Tessalon per orders. Risk score of 0 so no indication for antiviral at present time as risk > likely benefit. Patient enrolled in COVID monitoring program through MyChart. Strict ER precautions reviewed.   Follow Up Instructions: I discussed the assessment and treatment plan with the patient. The patient was provided an opportunity to ask questions and all were answered. The patient agreed with the plan and demonstrated an understanding of the instructions.  A copy of instructions were sent to the patient via MyChart.  The patient was advised to call back or seek an in-person evaluation if the symptoms worsen or if the condition fails to improve as anticipated.  Time:  I spent 13 minutes with the patient via telehealth technology discussing the above problems/concerns.    [name], PA-C

## 2020-09-12 NOTE — Telephone Encounter (Signed)
Noted, will follow-up after if ongoing symptoms.

## 2020-09-12 NOTE — Progress Notes (Signed)
  Video Visit is recommended for best care

## 2020-09-12 NOTE — Telephone Encounter (Signed)
Reason for Disposition  MILD difficulty breathing (e.g., minimal/no SOB at rest, SOB with walking, pulse <100)  Answer Assessment - Initial Assessment Questions 1. COVID-19 DIAGNOSIS: "Who made your COVID-19 diagnosis?" "Was it confirmed by a positive lab test or self-test?" If not diagnosed by a doctor (or NP/PA), ask "Are there lots of cases (community spread) where you live?" Note: See public health department website, if unsure.     Rapid test 2. COVID-19 EXPOSURE: "Was there any known exposure to COVID before the symptoms began?" CDC Definition of close contact: within 6 feet (2 meters) for a total of 15 minutes or more over a 24-hour period.      No 3. ONSET: "When did the COVID-19 symptoms start?"      Last Tuesday 4. WORST SYMPTOM: "What is your worst symptom?" (e.g., cough, fever, shortness of breath, muscle aches)     Cough 5. COUGH: "Do you have a cough?" If Yes, ask: "How bad is the cough?"       Yes 6. FEVER: "Do you have a fever?" If Yes, ask: "What is your temperature, how was it measured, and when did it start?"     No 7. RESPIRATORY STATUS: "Describe your breathing?" (e.g., shortness of breath, wheezing, unable to speak)      No 8. BETTER-SAME-WORSE: "Are you getting better, staying the same or getting worse compared to yesterday?"  If getting worse, ask, "In what way?"     Worse 9. HIGH RISK DISEASE: "Do you have any chronic medical problems?" (e.g., asthma, heart or lung disease, weak immune system, obesity, etc.)     No 10. VACCINE: "Have you had the COVID-19 vaccine?" If Yes, ask: "Which one, how many shots, when did you get it?"       Yes 11. BOOSTER: "Have you received your COVID-19 booster?" If Yes, ask: "Which one and when did you get it?"       No 12. PREGNANCY: "Is there any chance you are pregnant?" "When was your last menstrual period?"       No 13. OTHER SYMPTOMS: "Do you have any other symptoms?"  (e.g., chills, fatigue, headache, loss of smell or taste,  muscle pain, sore throat)       Ear pressure, sinus congestion 14. O2 SATURATION MONITOR:  "Do you use an oxygen saturation monitor (pulse oximeter) at home?" If Yes, ask "What is your reading (oxygen level) today?" "What is your usual oxygen saturation reading?" (e.g., 95%)       No  Protocols used: Coronavirus (COVID-19) Diagnosed or Suspected-A-AH

## 2020-09-12 NOTE — Telephone Encounter (Signed)
Pt. Tested positive for COVID 19 Monday. Has cough, sinus congestion, ears feel full. No availability with the practice today. Will try My Chart e-visit.

## 2020-09-14 ENCOUNTER — Telehealth (INDEPENDENT_AMBULATORY_CARE_PROVIDER_SITE_OTHER): Payer: Self-pay | Admitting: Nurse Practitioner

## 2020-09-14 ENCOUNTER — Encounter: Payer: Self-pay | Admitting: Nurse Practitioner

## 2020-09-14 DIAGNOSIS — U071 COVID-19: Secondary | ICD-10-CM

## 2020-09-14 MED ORDER — PREDNISONE 20 MG PO TABS: 40.0000 mg | ORAL_TABLET | Freq: Every day | ORAL | 0 refills | Status: AC

## 2020-09-14 NOTE — Progress Notes (Signed)
There were no vitals taken for this visit.   Subjective:    Patient ID: Brittney Knox, female    DOB: February 28, 1980, 40 y.o.   MRN: [id number]  HPI: Brittney Knox is a 40 y.o. female  Chief Complaint  Patient presents with   Nasal Congestion   sinus pressure   Covid Positive    Tested positive on 09/10/20 with an at home test. Symptoms started on last Tuesday with a scratchy throat and then the cough and congestion on Saturday evening   This visit was completed via video visit through MyChart due to the restrictions of the COVID-19 pandemic. All issues as above were discussed and addressed. Physical exam was done as above through visual confirmation on video through MyChart. If it was felt that the patient should be evaluated in the office, they were directed there. The patient verbally consented to this visit. Location of the patient: home Location of the provider: work Those involved with this call:  Provider: Aura Dials, DNP CMA: Malen Gauze, CMA Front Desk/Registration: Kandice Hams  Time spent on call:  21 minutes with patient face to face via video conference. More than 50% of this time was spent in counseling and coordination of care. 15 minutes total spent in review of patient's record and preparation of their chart.  I verified patient identity using two factors (patient name and date of birth). Patient consents verbally to being seen via telemedicine visit today.    COVID POSITIVE Tested positive for Covid 09/10/20 and started with symptoms on 09/08/20.  Was given Tessalon and Albuterol inhaler via a video UC visit 09/12/20.  Continues to have nagging cough. Fever: no Cough: yes Shortness of breath: no Wheezing: no Chest pain: no Chest tightness: yes Chest congestion: yes Nasal congestion: yes Runny nose: yes Post nasal drip: yes Sneezing: no Sore throat: no Swollen glands: no Sinus pressure: yes Headache: yes Face pain: no Toothache: no Ear pain: yes  bilateral Ear pressure: none Eyes red/itching:no Eye drainage/crusting: no  Vomiting: no Rash: no Fatigue: yes Sick contacts: no Strep contacts: no  Context: fluctuating Recurrent sinusitis: no Relief with OTC cold/cough medications: no  Treatments attempted: cold/sinus, mucinex, and anti-histamine    Relevant past medical, surgical, family and social history reviewed and updated as indicated. Interim medical history since our last visit reviewed. Allergies and medications reviewed and updated.  Review of Systems  Constitutional:  Positive for fatigue. Negative for activity change, appetite change and fever.  HENT:  Positive for congestion, postnasal drip, rhinorrhea and sinus pressure. Negative for ear discharge, ear pain, facial swelling, sinus pain, sneezing, sore throat and voice change.   Eyes:  Negative for pain and visual disturbance.  Respiratory:  Positive for cough and chest tightness. Negative for shortness of breath and wheezing.   Cardiovascular:  Negative for chest pain, palpitations and leg swelling.  Gastrointestinal: Negative.   Endocrine: Negative.   Neurological: Negative.   Psychiatric/Behavioral: Negative.     Per HPI unless specifically indicated above     Objective:    There were no vitals taken for this visit.  Wt Readings from Last 3 Encounters:  05/09/20 160 lb 6 oz (72.7 kg)  04/15/19 150 lb (68 kg)  02/28/19 152 lb (68.9 kg)    Physical Exam Vitals and nursing note reviewed.  Constitutional:      General: She is awake. She is not in acute distress.    Appearance: She is well-developed. She is not ill-appearing.  HENT:  Head: Normocephalic.     Right Ear: Hearing normal.     Left Ear: Hearing normal.  Eyes:     General: Lids are normal.        Right eye: No discharge.        Left eye: No discharge.     Conjunctiva/sclera: Conjunctivae normal.  Pulmonary:     Effort: Pulmonary effort is normal. No accessory muscle usage or respiratory  distress.     Comments: Intermittent dry cough noted.  No SOB with talking. Musculoskeletal:     Cervical back: Normal range of motion.  Neurological:     Mental Status: She is alert and oriented to person, place, and time.  Psychiatric:        Attention and Perception: Attention normal.        Mood and Affect: Mood normal.        Behavior: Behavior normal. Behavior is cooperative.        Thought Content: Thought content normal.        Judgment: Judgment normal.    Results for orders placed or performed in visit on 05/09/20  Urine Culture   Specimen: Urine   UR  Result Value Ref Range   Urine Culture, Routine Final report    Organism ID, Bacteria Comment       Assessment & Plan:   Problem List Items Addressed This Visit       Other   Lab test positive for detection of COVID-19 virus    Symptom start 09/08/20, past period for oral Covid treatment.  At this time is improving with exception of nagging cough.  Is taking Flonase, Tessalon, and Albuterol inhaler use.  Will send in script  For Prednisone 40 MG x 5 days.  Recommend she continue current regimen +: - Increased rest - Increasing Fluids - Acetaminophen / ibuprofen as needed for fever/pain.  - Salt water gargling, chloraseptic spray and throat lozenges - OTC pseudoephedrine.  - Mucinex.  If worsening or ongoing symptoms, return to clinic and we may obtain CXR.       I discussed the assessment and treatment plan with the patient. The patient was provided an opportunity to ask questions and all were answered. The patient agreed with the plan and demonstrated an understanding of the instructions.   The patient was advised to call back or seek an in-person evaluation if the symptoms worsen or if the condition fails to improve as anticipated.   I provided 21+ minutes of time during this encounter.   Follow up plan: Return if symptoms worsen or fail to improve.

## 2020-09-14 NOTE — Assessment & Plan Note (Signed)
Symptom start 09/08/20, past period for oral Covid treatment.  At this time is improving with exception of nagging cough.  Is taking Flonase, Tessalon, and Albuterol inhaler use.  Will send in script  For Prednisone 40 MG x 5 days.  Recommend she continue current regimen +: - Increased rest - Increasing Fluids - Acetaminophen / ibuprofen as needed for fever/pain.  - Salt water gargling, chloraseptic spray and throat lozenges - OTC pseudoephedrine.  - Mucinex.  If worsening or ongoing symptoms, return to clinic and we may obtain CXR.

## 2020-09-14 NOTE — Patient Instructions (Signed)

## 2021-01-28 ENCOUNTER — Encounter: Payer: Self-pay | Admitting: Nurse Practitioner

## 2021-01-28 ENCOUNTER — Other Ambulatory Visit: Payer: Self-pay

## 2021-01-28 ENCOUNTER — Ambulatory Visit (INDEPENDENT_AMBULATORY_CARE_PROVIDER_SITE_OTHER): Payer: BC Managed Care – PPO | Admitting: Nurse Practitioner

## 2021-01-28 VITALS — BP 106/71 | HR 84 | Temp 97.8°F | Ht 65.0 in | Wt 162.8 lb

## 2021-01-28 DIAGNOSIS — F3281 Premenstrual dysphoric disorder: Secondary | ICD-10-CM

## 2021-01-28 DIAGNOSIS — Z1159 Encounter for screening for other viral diseases: Secondary | ICD-10-CM | POA: Diagnosis not present

## 2021-01-28 DIAGNOSIS — D696 Thrombocytopenia, unspecified: Secondary | ICD-10-CM

## 2021-01-28 DIAGNOSIS — E7800 Pure hypercholesterolemia, unspecified: Secondary | ICD-10-CM

## 2021-01-28 DIAGNOSIS — E559 Vitamin D deficiency, unspecified: Secondary | ICD-10-CM | POA: Diagnosis not present

## 2021-01-28 DIAGNOSIS — Z0000 Encounter for general adult medical examination without abnormal findings: Secondary | ICD-10-CM

## 2021-01-28 DIAGNOSIS — Z862 Personal history of diseases of the blood and blood-forming organs and certain disorders involving the immune mechanism: Secondary | ICD-10-CM | POA: Insufficient documentation

## 2021-01-28 MED ORDER — CITALOPRAM HYDROBROMIDE 10 MG PO TABS: 10.0000 mg | ORAL_TABLET | Freq: Every day | ORAL | 4 refills | Status: DC

## 2021-01-28 NOTE — Assessment & Plan Note (Signed)
Recheck CBC today -- ongoing since 2017 = 120-137 on labs. 

## 2021-01-28 NOTE — Assessment & Plan Note (Signed)
Noted past labs, recheck today and initiate medication as needed.  Lipid panel and CMP today. 

## 2021-01-28 NOTE — Assessment & Plan Note (Signed)
Chronic, ongoing with poor control on Prozac -- GAD7 = 17 and PHQ9 = 6.  Discussed at length with patient, will switch to Celexa 10 MG daily and adjust further as needed based on mood.  Educated her on this medication, has more anticholinergic affect which may help more with anxiety element.  Can do direct switch and stop Prozac at this time.  Denies SI/HI. Return in 4 weeks for follow-up.

## 2021-01-28 NOTE — Assessment & Plan Note (Signed)
History of on past labs -- check CBC, iron, ferritin today. 

## 2021-01-28 NOTE — Patient Instructions (Signed)

## 2021-01-28 NOTE — Progress Notes (Signed)
BP 106/71    Pulse 84    Temp 97.8 F (36.6 C) (Oral)    Ht 5' 5"  (1.651 m)    Wt 162 lb 12.8 oz (73.8 kg)    SpO2 98%    BMI 27.09 kg/m    Subjective:    Patient ID: Brittney Knox, female    DOB: 18-Aug-1980, 41 y.o.   MRN: [id number]  HPI: Brittney Knox is a 41 y.o. female presenting on 01/28/2021 for comprehensive medical examination. Current medical complaints include:none  She currently lives with: husband and kid Menopausal Symptoms: no  DEPRESSION Was started on Prozac by Dr. Kenton Kingfisher two years ago, for PMS symptoms.  This does not help anxiety much per patient.  Does have increased stressors in life, teenage daughter. Mood status: uncontrolled Satisfied with current treatment?: yes Symptom severity: moderate  Duration of current treatment : chronic Side effects: no Medication compliance: good compliance Psychotherapy/counseling: yes in the past Previous psychiatric medications: Prozac Depressed mood: no Anxious mood: yes Anhedonia: no Significant weight loss or gain: no Insomnia: none Fatigue: no Feelings of worthlessness or guilt: no Impaired concentration/indecisiveness: yes Suicidal ideations: no Hopelessness: no Crying spells: no Depression screen [facility] 2/9 01/28/2021 09/14/2020 05/09/2020 04/15/2019 08/06/2017  Decreased Interest - 0 0 0 0  Down, Depressed, Hopeless - 0 0 0 0  PHQ - 2 Score - 0 0 0 0  Altered sleeping 2 0 - 0 2  Tired, decreased energy 0 0 - 0 1  Change in appetite 0 0 - 0 1  Feeling bad or failure about yourself  0 0 - 0 0  Trouble concentrating 2 0 - 0 0  Moving slowly or fidgety/restless 2 0 - 0 0  Suicidal thoughts 0 0 - 0 0  PHQ-9 Score - 0 - 0 4  Difficult doing work/chores - - - Not difficult at all -    GAD 7 : Generalized Anxiety Score 01/28/2021 04/15/2019  Nervous, Anxious, on Edge 3 0  Control/stop worrying 2 0  Worry too much - different things 2 0  Trouble relaxing 3 0  Restless 3 0  Easily annoyed or irritable 3 0  Afraid - awful  might happen 1 0  Total GAD 7 Score 17 0  Anxiety Difficulty Not difficult at all Not difficult at all     The patient does not have a history of falls. I did not complete a risk assessment for falls. A plan of care for falls was not documented.   Past Medical History:  Past Medical History:  Diagnosis Date   Anemia    BRCA negative 12/2012   MyRisk neg; CDKN2A VUS   Family history of breast cancer 12/2012   IBIS=15%   Family history of ovarian cancer    GERD (gastroesophageal reflux disease)    OCC -NO MEDS   LGSIL on Pap smear of cervix     Surgical History:  Past Surgical History:  Procedure Laterality Date   CESAREAN SECTION     X2   DILITATION & CURRETTAGE/HYSTROSCOPY WITH NOVASURE ABLATION N/A 02/11/2018   Procedure: DILATATION & CURETTAGE/HYSTEROSCOPY WITH ENDOMETRIAL ABLATION - MINERVA & ACCESS TO NOVASURE;  Surgeon: Gae Dry, MD;  Location: [facility] ORS;  Service: Gynecology;  Laterality: N/A;   TONSILLECTOMY      Medications:  Current Outpatient Medications on File Prior to Visit  Medication Sig   albuterol (VENTOLIN HFA) 108 (90 Base) MCG/ACT inhaler Inhale 2 puffs into the lungs every 6 (six) hours  as needed for wheezing or shortness of breath.   No current facility-administered medications on file prior to visit.    Allergies:  Allergies  Allergen Reactions   Morphine And Related Itching    Social History:  Social History   Socioeconomic History   Marital status: Married    Spouse name: Not on file   Number of children: Not on file   Years of education: Not on file   Highest education level: Not on file  Occupational History   Not on file  Tobacco Use   Smoking status: Never   Smokeless tobacco: Never  Vaping Use   Vaping Use: Never used  Substance and Sexual Activity   Alcohol use: Yes    Comment: SOCIAL   Drug use: No   Sexual activity: Yes    Birth control/protection: None  Other Topics Concern   Not on file  Social History  Narrative   Not on file   Social Determinants of Health   Financial Resource Strain: Low Risk    Difficulty of Paying Living Expenses: Not hard at all  Food Insecurity: No Food Insecurity   Worried About Charity fundraiser in the Last Year: Never true   [place] in the Last Year: Never true  Transportation Needs: No Transportation Needs   Lack of Transportation (Medical): No   Lack of Transportation (Non-Medical): No  Physical Activity: Insufficiently Active   Days of Exercise per Week: 3 days   Minutes of Exercise per Session: 30 min  Stress: Stress Concern Present   Feeling of Stress : To some extent  Social Connections: Moderately Isolated   Frequency of Communication with Friends and Family: More than three times a week   Frequency of Social Gatherings with Friends and Family: More than three times a week   Attends Religious Services: Never   Marine scientist or Organizations: No   Attends Music therapist: Never   Marital Status: Married  Human resources officer Violence: Not At Risk   Fear of Current or Ex-Partner: No   Emotionally Abused: No   Physically Abused: No   Sexually Abused: No   Social History   Tobacco Use  Smoking Status Never  Smokeless Tobacco Never   Social History   Substance and Sexual Activity  Alcohol Use Yes   Comment: SOCIAL    Family History:  Family History  Problem Relation Age of Onset   Arthritis Mother        RA   Heart disease Mother    Hypothyroidism Mother    Heart disease Father        A fib   Diabetes Father    Hypothyroidism Sister    Breast cancer Maternal Grandmother 75       and 79   Diabetes Maternal Grandfather    Ovarian cancer Paternal Grandmother 1   Breast cancer Cousin    Breast cancer Maternal Aunt     Past medical history, surgical history, medications, allergies, family history and social history reviewed with patient today and changes made to appropriate areas of the chart.    ROS All other ROS negative except what is listed above and in the HPI.      Objective:    BP 106/71    Pulse 84    Temp 97.8 F (36.6 C) (Oral)    Ht 5' 5"  (1.651 m)    Wt 162 lb 12.8 oz (73.8 kg)    SpO2 98%  BMI 27.09 kg/m   Wt Readings from Last 3 Encounters:  01/28/21 162 lb 12.8 oz (73.8 kg)  05/09/20 160 lb 6 oz (72.7 kg)  04/15/19 150 lb (68 kg)    Physical Exam Vitals and nursing note reviewed. Exam conducted with a chaperone present.  Constitutional:      General: She is awake. She is not in acute distress.    Appearance: She is well-developed and well-groomed. She is not ill-appearing or toxic-appearing.  HENT:     Head: Normocephalic and atraumatic.     Right Ear: Hearing, tympanic membrane, ear canal and external ear normal. No drainage.     Left Ear: Hearing, tympanic membrane, ear canal and external ear normal. No drainage.     Nose: Nose normal.     Right Sinus: No maxillary sinus tenderness or frontal sinus tenderness.     Left Sinus: No maxillary sinus tenderness or frontal sinus tenderness.     Mouth/Throat:     Mouth: Mucous membranes are moist.     Pharynx: Oropharynx is clear. Uvula midline. No pharyngeal swelling, oropharyngeal exudate or posterior oropharyngeal erythema.  Eyes:     General: Lids are normal.        Right eye: No discharge.        Left eye: No discharge.     Extraocular Movements: Extraocular movements intact.     Conjunctiva/sclera: Conjunctivae normal.     Pupils: Pupils are equal, round, and reactive to light.     Visual Fields: Right eye visual fields normal and left eye visual fields normal.  Neck:     Thyroid: No thyromegaly.     Vascular: No carotid bruit.     Trachea: Trachea normal.  Cardiovascular:     Rate and Rhythm: Normal rate and regular rhythm.     Heart sounds: Normal heart sounds. No murmur heard.   No gallop.  Pulmonary:     Effort: Pulmonary effort is normal. No accessory muscle usage or respiratory  distress.     Breath sounds: Normal breath sounds.  Chest:  Breasts:    Right: Normal.     Left: Normal.  Abdominal:     General: Bowel sounds are normal.     Palpations: Abdomen is soft. There is no hepatomegaly or splenomegaly.     Tenderness: There is no abdominal tenderness.  Musculoskeletal:        General: Normal range of motion.     Cervical back: Normal range of motion and neck supple.     Right lower leg: No edema.     Left lower leg: No edema.  Lymphadenopathy:     Head:     Right side of head: No submental, submandibular, tonsillar, preauricular or posterior auricular adenopathy.     Left side of head: No submental, submandibular, tonsillar, preauricular or posterior auricular adenopathy.     Cervical: No cervical adenopathy.     Upper Body:     Right upper body: No supraclavicular, axillary or pectoral adenopathy.     Left upper body: No supraclavicular, axillary or pectoral adenopathy.  Skin:    General: Skin is warm and dry.     Capillary Refill: Capillary refill takes less than 2 seconds.     Findings: No rash.  Neurological:     Mental Status: She is alert and oriented to person, place, and time.     Gait: Gait is intact.     Deep Tendon Reflexes: Reflexes are normal and symmetric.  Reflex Scores:      Brachioradialis reflexes are 2+ on the right side and 2+ on the left side.      Patellar reflexes are 2+ on the right side and 2+ on the left side. Psychiatric:        Attention and Perception: Attention normal.        Mood and Affect: Mood normal.        Speech: Speech normal.        Behavior: Behavior normal. Behavior is cooperative.        Thought Content: Thought content normal.        Judgment: Judgment normal.    Results for orders placed or performed in visit on 05/09/20  Urine Culture   Specimen: Urine   UR  Result Value Ref Range   Urine Culture, Routine Final report    Organism ID, Bacteria Comment       Assessment & Plan:   Problem  List Items Addressed This Visit       Hematopoietic and Hemostatic   Thrombocytopenia ([place]) - Primary    Recheck CBC today -- ongoing since 2017 = 120-137 on labs.      Relevant Orders   CBC with Differential/Platelet   TSH     Other   Elevated low density lipoprotein (LDL) cholesterol level    Noted past labs, recheck today and initiate medication as needed.  Lipid panel and CMP today.      Relevant Orders   Comprehensive metabolic panel   Lipid Panel w/o Chol/HDL Ratio   History of iron deficiency anemia    History of on past labs -- check CBC, iron, ferritin today.      Relevant Orders   CBC with Differential/Platelet   Iron Binding Cap (TIBC)([facility])   Ferritin   PMDD (premenstrual dysphoric disorder)    Chronic, ongoing with poor control on Prozac -- GAD7 = 17 and PHQ9 = 6.  Discussed at length with patient, will switch to Celexa 10 MG daily and adjust further as needed based on mood.  Educated her on this medication, has more anticholinergic affect which may help more with anxiety element.  Can do direct switch and stop Prozac at this time.  Denies SI/HI. Return in 4 weeks for follow-up.        Relevant Medications   citalopram (CELEXA) 10 MG tablet   Other Visit Diagnoses     Vitamin D deficiency       History of low levels, check on labs today and start supplement as needed.   Relevant Orders   VITAMIN D 25 Hydroxy (Vit-D Deficiency, Fractures)   Need for hepatitis C screening test       Hep C screening on labs today per guidelines, discussed with patient.   Relevant Orders   Hepatitis C antibody   Encounter for annual physical exam       Annual physical today with labs, health maintenance reviewed with patient.   Relevant Orders   CBC with Differential/Platelet   Comprehensive metabolic panel   TSH        Follow up plan: Return in about 4 weeks (around 02/25/2021) for MOOD -- may be virtual.   LABORATORY TESTING:  - Pap smear: pap  done  IMMUNIZATIONS:   - Tdap: Tetanus vaccination status reviewed: last tetanus booster within 10 years. - Influenza: Up to date - Pneumovax: Not applicable - Prevnar: Not applicable - COVID: Up to date - HPV: Not applicable - Shingrix vaccine: Not  applicable  SCREENING: -Mammogram: Not applicable  - Colonoscopy: Not applicable  - Bone Density: Not applicable  -Hearing Test: Not applicable  -Spirometry: Not applicable   PATIENT COUNSELING:   Advised to take 1 mg of folate supplement per day if capable of pregnancy.   Sexuality: Discussed sexually transmitted diseases, partner selection, use of condoms, avoidance of unintended pregnancy  and contraceptive alternatives.   Advised to avoid cigarette smoking.  I discussed with the patient that most people either abstain from alcohol or drink within safe limits (<=14/week and <=4 drinks/occasion for males, <=7/weeks and <= 3 drinks/occasion for females) and that the risk for alcohol disorders and other health effects rises proportionally with the number of drinks per week and how often a drinker exceeds daily limits.  Discussed cessation/primary prevention of drug use and availability of treatment for abuse.   Diet: Encouraged to adjust caloric intake to maintain  or achieve ideal body weight, to reduce intake of dietary saturated fat and total fat, to limit sodium intake by avoiding high sodium foods and not adding table salt, and to maintain adequate dietary potassium and calcium preferably from fresh fruits, vegetables, and low-fat dairy products.    stressed the importance of regular exercise  Injury prevention: Discussed safety belts, safety helmets, smoke detector, smoking near bedding or upholstery.   Dental health: Discussed importance of regular tooth brushing, flossing, and dental visits.    NEXT PREVENTATIVE PHYSICAL DUE IN 1 YEAR. Return in about 4 weeks (around 02/25/2021) for MOOD -- may be virtual.

## 2021-01-29 ENCOUNTER — Encounter: Payer: Self-pay | Admitting: Nurse Practitioner

## 2021-01-29 ENCOUNTER — Encounter: Payer: Medicaid Other | Admitting: Nurse Practitioner

## 2021-01-29 LAB — TSH: TSH: 3.14 u[IU]/mL (ref 0.450–4.500)

## 2021-01-29 LAB — IRON AND TIBC
Iron Saturation: 28 % (ref 15–55)
Iron: 95 ug/dL (ref 27–159)
Total Iron Binding Capacity: 336 ug/dL (ref 250–450)
UIBC: 241 ug/dL (ref 131–425)

## 2021-01-29 LAB — COMPREHENSIVE METABOLIC PANEL
ALT: 12 IU/L (ref 0–32)
AST: 14 IU/L (ref 0–40)
Albumin/Globulin Ratio: 2.2 (ref 1.2–2.2)
Albumin: 4.6 g/dL (ref 3.8–4.8)
Alkaline Phosphatase: 70 IU/L (ref 44–121)
BUN/Creatinine Ratio: 14 (ref 9–23)
BUN: 11 mg/dL (ref 6–20)
Bilirubin Total: 0.6 mg/dL (ref 0.0–1.2)
CO2: 22 mmol/L (ref 20–29)
Calcium: 9.4 mg/dL (ref 8.7–10.2)
Chloride: 104 mmol/L (ref 96–106)
Creatinine, Ser: 0.77 mg/dL (ref 0.57–1.00)
Globulin, Total: 2.1 g/dL (ref 1.5–4.5)
Glucose: 106 mg/dL — ABNORMAL HIGH (ref 70–99)
Potassium: 4.6 mmol/L (ref 3.5–5.2)
Sodium: 142 mmol/L (ref 134–144)
Total Protein: 6.7 g/dL (ref 6.0–8.5)
eGFR: 101 mL/min/{1.73_m2} (ref >59)

## 2021-01-29 LAB — CBC WITH DIFFERENTIAL/PLATELET
Basophils Absolute: 0.1 10*3/uL (ref 0.0–0.2)
Basos: 1 %
EOS (ABSOLUTE): 0.2 10*3/uL (ref 0.0–0.4)
Eos: 3 %
Hematocrit: 41.5 % (ref 34.0–46.6)
Hemoglobin: 14.3 g/dL (ref 11.1–15.9)
Immature Grans (Abs): 0 10*3/uL (ref 0.0–0.1)
Immature Granulocytes: 0 %
Lymphocytes Absolute: 1.6 10*3/uL (ref 0.7–3.1)
Lymphs: 23 %
MCH: 33.7 pg — ABNORMAL HIGH (ref 26.6–33.0)
MCHC: 34.5 g/dL (ref 31.5–35.7)
MCV: 98 fL — ABNORMAL HIGH (ref 79–97)
Monocytes Absolute: 0.5 10*3/uL (ref 0.1–0.9)
Monocytes: 7 %
Neutrophils Absolute: 4.6 10*3/uL (ref 1.4–7.0)
Neutrophils: 66 %
Platelets: 143 10*3/uL — ABNORMAL LOW (ref 150–450)
RBC: 4.24 x10E6/uL (ref 3.77–5.28)
RDW: 11.7 % (ref 11.7–15.4)
WBC: 7 10*3/uL (ref 3.4–10.8)

## 2021-01-29 LAB — LIPID PANEL W/O CHOL/HDL RATIO
Cholesterol, Total: 198 mg/dL (ref 100–199)
HDL: 63 mg/dL (ref >39)
LDL Chol Calc (NIH): 112 mg/dL — ABNORMAL HIGH (ref 0–99)
Triglycerides: 133 mg/dL (ref 0–149)
VLDL Cholesterol Cal: 23 mg/dL (ref 5–40)

## 2021-01-29 LAB — FERRITIN: Ferritin: 97 ng/mL (ref 15–150)

## 2021-01-29 LAB — HEPATITIS C ANTIBODY: Hep C Virus Ab: <0.1 s/co ratio (ref 0.0–0.9)

## 2021-01-29 LAB — VITAMIN D 25 HYDROXY (VIT D DEFICIENCY, FRACTURES): Vit D, 25-Hydroxy: 22.6 ng/mL — ABNORMAL LOW (ref 30.0–100.0)

## 2021-01-29 MED ORDER — CITALOPRAM HYDROBROMIDE 10 MG PO TABS: 10.0000 mg | ORAL_TABLET | Freq: Every day | ORAL | 4 refills | Status: DC

## 2021-01-29 NOTE — Progress Notes (Signed)
Contacted via MyChart   Good evening Brittney Knox, your labs have returned: - CBC continues to show stable hemoglobin and hematocrit levels.  Great news + iron and ferritin levels are normal -- continue supplements. - Platelets remain a little on low side, this is something we will continue to monitor annually and if ever get too low we would send you to hematology. - Kidney and liver function are normal. - Vitamin D level a little low, ensure to take Vitamin D3 2000 units daily for bone and muscle health. - Thyroid lab is normal - Hep C screening is negative. - Your LDL is above normal. The LDL is the bad cholesterol. Over time and in combination with inflammation and other factors, this contributes to plaque which in turn may lead to stroke and/or heart attack down the road. Sometimes high LDL is primarily genetic, and people might be eating all the right foods but still have high numbers. Other times, there is room for improvement in one's diet and eating healthier can bring this number down and potentially reduce one's risk of heart attack and/or stroke.   To reduce your LDL, Remember - more fruits and vegetables, more fish, and limit red meat and dairy products. More soy, nuts, beans, barley, lentils, oats and plant sterol ester enriched margarine instead of butter. I also encourage eliminating sugar and processed food. Remember, shop on the outside of the grocery store and visit your [employer]. If you would like to talk with me about dietary changes for your cholesterol, please let me know. We should recheck your cholesterol in 12 months.  Any questions? Keep being amazing!!  Thank you for allowing me to participate in your care.  I appreciate you. Kindest regards, Mckinlee Dunk

## 2021-02-23 DIAGNOSIS — E559 Vitamin D deficiency, unspecified: Secondary | ICD-10-CM | POA: Insufficient documentation

## 2021-02-23 NOTE — Patient Instructions (Signed)
Managing Anxiety, Adult ?After being diagnosed with anxiety, you may be relieved to know why you have felt or behaved a certain way. You may also feel overwhelmed about the treatment ahead and what it will mean for your life. With care and support, you can manage this condition. ?How to manage lifestyle changes ?Managing stress and anxiety ?Stress is your body's reaction to life changes and events, both good and bad. Most stress will last just a few hours, but stress can be ongoing and can lead to more than just stress. Although stress can play a major role in anxiety, it is not the same as anxiety. Stress is usually caused by something external, such as a deadline, test, or competition. Stress normally passes after the triggering event has ended.  ?Anxiety is caused by something internal, such as imagining a terrible outcome or worrying that something will go wrong that will devastate you. Anxiety often does not go away even after the triggering event is over, and it can become long-term (chronic) worry. It is important to understand the differences between stress and anxiety and to manage your stress effectively so that it does not lead to an anxious response. ?Talk with your health care provider or a counselor to learn more about reducing anxiety and stress. He or she may suggest tension reduction techniques, such as: ?Music therapy. Spend time creating or listening to music that you enjoy and that inspires you. ?Mindfulness-based meditation. Practice being aware of your normal breaths while not trying to control your breathing. It can be done while sitting or walking. ?Centering prayer. This involves focusing on a word, phrase, or sacred image that means something to you and brings you peace. ?Deep breathing. To do this, expand your stomach and inhale slowly through your nose. Hold your breath for 3-5 seconds. Then exhale slowly, letting your stomach muscles relax. ?Self-talk. Learn to notice and identify  thought patterns that lead to anxiety reactions and change those patterns to thoughts that feel peaceful. ?Muscle relaxation. Taking time to tense muscles and then relax them. ?Choose a tension reduction technique that fits your lifestyle and personality. These techniques take time and practice. Set aside 5-15 minutes a day to do them. Therapists can offer counseling and training in these techniques. The training to help with anxiety may be covered by some insurance plans. ?Other things you can do to manage stress and anxiety include: ?Keeping a stress diary. This can help you learn what triggers your reaction and then learn ways to manage your response. ?Thinking about how you react to certain situations. You may not be able to control everything, but you can control your response. ?Making time for activities that help you relax and not feeling guilty about spending your time in this way. ?Doing visual imagery. This involves imagining or creating mental pictures to help you relax. ?Practicing yoga. Through yoga poses, you can lower tension and promote relaxation. ? ?Medicines ?Medicines can help ease symptoms. Medicines for anxiety include: ?Antidepressant medicines. These are usually prescribed for long-term daily control. ?Anti-anxiety medicines. These may be added in severe cases, especially when panic attacks occur. ?Medicines will be prescribed by a health care provider. When used together, medicines, psychotherapy, and tension reduction techniques may be the most effective treatment. ?Relationships ?Relationships can play a big part in helping you recover. Try to spend more time connecting with trusted friends and family members. ?Consider going to couples counseling if you have a partner, taking family education classes, or going to family   therapy. ?Therapy can help you and others better understand your condition. ?How to recognize changes in your anxiety ?Everyone responds differently to treatment for  anxiety. Recovery from anxiety happens when symptoms decrease and stop interfering with your daily activities at home or work. This may mean that you will start to: ?Have better concentration and focus. Worry will interfere less in your daily thinking. ?Sleep better. ?Be less irritable. ?Have more energy. ?Have improved memory. ?It is also important to recognize when your condition is getting worse. Contact your health care provider if your symptoms interfere with home or work and you feel like your condition is not improving. ?Follow these instructions at home: ?Activity ?Exercise. Adults should do the following: ?Exercise for at least 150 minutes each week. The exercise should increase your heart rate and make you sweat (moderate-intensity exercise). ?Strengthening exercises at least twice a week. ?Get the right amount and quality of sleep. Most adults need 7-9 hours of sleep each night. ?Lifestyle ? ?Eat a healthy diet that includes plenty of vegetables, fruits, whole grains, low-fat dairy products, and lean protein. ?Do not eat a lot of foods that are high in fats, added sugars, or salt (sodium). ?Make choices that simplify your life. ?Do not use any products that contain nicotine or tobacco. These products include cigarettes, chewing tobacco, and vaping devices, such as e-cigarettes. If you need help quitting, ask your health care provider. ?Avoid caffeine, alcohol, and certain over-the-counter cold medicines. These may make you feel worse. Ask your pharmacist which medicines to avoid. ?General instructions ?Take over-the-counter and prescription medicines only as told by your health care provider. ?Keep all follow-up visits. This is important. ?Where to find support ?You can get help and support from these sources: ?Self-help groups. ?Online and community organizations. ?A trusted spiritual leader. ?Couples counseling. ?Family education classes. ?Family therapy. ?Where to find more information ?You may find  that joining a support group helps you deal with your anxiety. The following sources can help you locate counselors or support groups near you: ?Mental Health America: www.mentalhealthamerica.net ?Anxiety and Depression Association of America (ADAA): www.adaa.org ?National Alliance on Mental Illness (NAMI): www.nami.org ?Contact a health care provider if: ?You have a hard time staying focused or finishing daily tasks. ?You spend many hours a day feeling worried about everyday life. ?You become exhausted by worry. ?You start to have headaches or frequently feel tense. ?You develop chronic nausea or diarrhea. ?Get help right away if: ?You have a racing heart and shortness of breath. ?You have thoughts of hurting yourself or others. ?If you ever feel like you may hurt yourself or others, or have thoughts about taking your own life, get help right away. Go to your nearest emergency department or: ?Call your local emergency services (911 in the [place] ?Call a suicide crisis helpline, such as the National Suicide Prevention Lifeline at 1-800-273-8255 or 988 in the [place] This is open 24 hours a day in the [place] ?Text the Crisis Text Line at 741741 (in the [place] ?Summary ?Taking steps to learn and use tension reduction techniques can help calm you and help prevent triggering an anxiety reaction. ?When used together, medicines, psychotherapy, and tension reduction techniques may be the most effective treatment. ?Family, friends, and partners can play a big part in supporting you. ?This information is not intended to replace advice given to you by your health care provider. Make sure you discuss any questions you have with your health care provider. ?Document Revised: 07/18/2020 Document Reviewed: 04/15/2020 ?Elsevier Patient   Education ? 2022 Elsevier Inc. ? ?

## 2021-02-25 ENCOUNTER — Telehealth (INDEPENDENT_AMBULATORY_CARE_PROVIDER_SITE_OTHER): Payer: Self-pay | Admitting: Nurse Practitioner

## 2021-02-25 DIAGNOSIS — F3281 Premenstrual dysphoric disorder: Secondary | ICD-10-CM

## 2021-02-25 NOTE — Progress Notes (Signed)
No show, did not answer phone or call back.

## 2021-04-24 ENCOUNTER — Encounter: Payer: Self-pay | Admitting: Internal Medicine

## 2021-04-24 ENCOUNTER — Ambulatory Visit: Payer: Self-pay | Admitting: Internal Medicine

## 2021-04-24 ENCOUNTER — Telehealth: Payer: Self-pay

## 2021-04-24 VITALS — BP 107/71 | HR 88 | Temp 98.2°F | Ht 65.0 in | Wt 163.6 lb

## 2021-04-24 DIAGNOSIS — N200 Calculus of kidney: Secondary | ICD-10-CM

## 2021-04-24 DIAGNOSIS — M545 Low back pain, unspecified: Secondary | ICD-10-CM

## 2021-04-24 MED ORDER — LIDOCAINE 5 % EX PTCH: 1.0000 | MEDICATED_PATCH | CUTANEOUS | 0 refills | Status: DC

## 2021-04-24 MED ORDER — TAMSULOSIN HCL 0.4 MG PO CAPS: 0.4000 mg | ORAL_CAPSULE | Freq: Every day | ORAL | 1 refills | Status: DC

## 2021-04-24 MED ORDER — ACETAMINOPHEN-CODEINE #3 300-30 MG PO TABS: 1.0000 | ORAL_TABLET | Freq: Two times a day (BID) | ORAL | 0 refills | Status: AC | PRN

## 2021-04-24 NOTE — Telephone Encounter (Signed)
Pt returned call and reported that her appt today is for back and hip pain.  ?

## 2021-04-24 NOTE — Progress Notes (Signed)
? ?There were no vitals taken for this visit.  ? ?Subjective:  ? ? Patient ID: Brittney Knox, female    DOB: 07/02/1980, 41 y.o.   MRN: [id number] ? ?No chief complaint on file. ? ? ?HPI: ?Brittney Knox is a 41 y.o. female ? ?Back Pain ?This is a new (co right sided paraspinal pain , radiating to the groin. had a CT abdomen for simiar pain on the right side) problem. The current episode started in the past 7 days.  ? ?No chief complaint on file. ? ? ?Relevant past medical, surgical, family and social history reviewed and updated as indicated. Interim medical history since our last visit reviewed. ?Allergies and medications reviewed and updated. ? ?Review of Systems  ?Musculoskeletal:  Positive for back pain.  ? ?Per HPI unless specifically indicated above ? ?   ?Objective:  ?  ?There were no vitals taken for this visit.  ?Wt Readings from Last 3 Encounters:  ?01/28/21 162 lb 12.8 oz (73.8 kg)  ?05/09/20 160 lb 6 oz (72.7 kg)  ?04/15/19 150 lb (68 kg)  ?  ?Physical Exam ?Vitals and nursing note reviewed.  ?Constitutional:   ?   General: She is not in acute distress. ?   Appearance: Normal appearance. She is not ill-appearing or diaphoretic.  ?Pulmonary:  ?   Breath sounds: No rhonchi.  ?Abdominal:  ?   General: Bowel sounds are normal. There is no distension.  ?   Palpations: Abdomen is soft.  ?   Tenderness: There is no abdominal tenderness. There is no guarding.  ?Musculoskeletal:     ?   General: Tenderness present.  ?   Comments: Right sided paraspinal tenderness   ?Skin: ?   General: Skin is warm and dry.  ?   Coloration: Skin is not jaundiced.  ?   Findings: No erythema.  ?Neurological:  ?   Mental Status: She is alert.  ?   Cranial Nerves: No cranial nerve deficit.  ? ? ?Results for orders placed or performed in visit on 01/28/21  ?CBC with Differential/Platelet  ?Result Value Ref Range  ? WBC 7.0 3.4 - 10.8 x10E3/uL  ? RBC 4.24 3.77 - 5.28 x10E6/uL  ? Hemoglobin 14.3 11.1 - 15.9 g/dL  ? Hematocrit 41.5 34.0  - 46.6 %  ? MCV 98 (H) 79 - 97 fL  ? MCH 33.7 (H) 26.6 - 33.0 pg  ? MCHC 34.5 31.5 - 35.7 g/dL  ? RDW 11.7 11.7 - 15.4 %  ? Platelets 143 (L) 150 - 450 x10E3/uL  ? Neutrophils 66 Not Estab. %  ? Lymphs 23 Not Estab. %  ? Monocytes 7 Not Estab. %  ? Eos 3 Not Estab. %  ? Basos 1 Not Estab. %  ? Neutrophils Absolute 4.6 1.4 - 7.0 x10E3/uL  ? Lymphocytes Absolute 1.6 0.7 - 3.1 x10E3/uL  ? Monocytes Absolute 0.5 0.1 - 0.9 x10E3/uL  ? EOS (ABSOLUTE) 0.2 0.0 - 0.4 x10E3/uL  ? Basophils Absolute 0.1 0.0 - 0.2 x10E3/uL  ? Immature Granulocytes 0 Not Estab. %  ? Immature Grans (Abs) 0.0 0.0 - 0.1 x10E3/uL  ?Comprehensive metabolic panel  ?Result Value Ref Range  ? Glucose 106 (H) 70 - 99 mg/dL  ? BUN 11 6 - 20 mg/dL  ? Creatinine, Ser 0.77 0.57 - 1.00 mg/dL  ? eGFR 101 >59 mL/min/1.73  ? BUN/Creatinine Ratio 14 9 - 23  ? Sodium 142 134 - 144 mmol/L  ? Potassium 4.6 3.5 - 5.2 mmol/L  ?  Chloride 104 96 - 106 mmol/L  ? CO2 22 20 - 29 mmol/L  ? Calcium 9.4 8.7 - 10.2 mg/dL  ? Total Protein 6.7 6.0 - 8.5 g/dL  ? Albumin 4.6 3.8 - 4.8 g/dL  ? Globulin, Total 2.1 1.5 - 4.5 g/dL  ? Albumin/Globulin Ratio 2.2 1.2 - 2.2  ? Bilirubin Total 0.6 0.0 - 1.2 mg/dL  ? Alkaline Phosphatase 70 44 - 121 IU/L  ? AST 14 0 - 40 IU/L  ? ALT 12 0 - 32 IU/L  ?Lipid Panel w/o Chol/HDL Ratio  ?Result Value Ref Range  ? Cholesterol, Total 198 100 - 199 mg/dL  ? Triglycerides 133 0 - 149 mg/dL  ? HDL 63 >39 mg/dL  ? VLDL Cholesterol Cal 23 5 - 40 mg/dL  ? LDL Chol Calc (NIH) 112 (H) 0 - 99 mg/dL  ?TSH  ?Result Value Ref Range  ? TSH 3.140 0.450 - 4.500 uIU/mL  ?Hepatitis C antibody  ?Result Value Ref Range  ? Hep C Virus Ab <0.1 0.0 - 0.9 s/co ratio  ?VITAMIN D 25 Hydroxy (Vit-D Deficiency, Fractures)  ?Result Value Ref Range  ? Vit D, 25-Hydroxy 22.6 (L) 30.0 - 100.0 ng/mL  ?Iron Binding Cap (TIBC)([facility])  ?Result Value Ref Range  ? Total Iron Binding Capacity 336 250 - 450 ug/dL  ? UIBC 241 131 - 425 ug/dL  ? Iron 95 27 - 159 ug/dL  ? Iron  Saturation 28 15 - 55 %  ?Ferritin  ?Result Value Ref Range  ? Ferritin 97 15 - 150 ng/mL  ? ?   ? ? ?Current Outpatient Medications:  ??  albuterol (VENTOLIN HFA) 108 (90 Base) MCG/ACT inhaler, Inhale 2 puffs into the lungs every 6 (six) hours as needed for wheezing or shortness of breath., Disp: 8 g, Rfl: 0 ??  citalopram (CELEXA) 10 MG tablet, Take 1 tablet (10 mg total) by mouth daily., Disp: 90 tablet, Rfl: 4  ? ? ?Assessment & Plan:  ?Flank pain ? Sec to nephrolithiasis  ?Check UA  ?Start pt on flomax ?CT @ [facility] last week showed such on the right side.  ?Refer to urology  ? ? ?Back pain check L spine xray  ?probably secondary to muscle spasms rvs nephrolithiasis.  ?most likely musckulskelteal though ?adviced streches for back ?Patient advised to take medication as directed here. Patient advised to rest initially and then slowly increase activity level. Monitor changes in symptoms such as numbness, tingling or weakness in legs, changes in bowel or bladder habits or worsening back pain. Proper ergonomics discussed.  ? ?Problem List Items Addressed This Visit   ?None ?  ? ?No orders of the defined types were placed in this encounter. ?  ? ?No orders of the defined types were placed in this encounter. ?  ? ?Follow up plan: ?No follow-ups on file. ? ? ?

## 2021-04-24 NOTE — Telephone Encounter (Signed)
Called patient to inquire about her appt today at 4pm and see what she needed ?

## 2021-04-25 ENCOUNTER — Ambulatory Visit: Payer: Self-pay | Admitting: *Deleted

## 2021-04-25 LAB — URINALYSIS, ROUTINE W REFLEX MICROSCOPIC
Bilirubin, UA: NEGATIVE
Glucose, UA: NEGATIVE
Nitrite, UA: NEGATIVE
Protein,UA: NEGATIVE
RBC, UA: NEGATIVE
Specific Gravity, UA: 1.025 (ref 1.005–1.030)
Urobilinogen, Ur: 0.2 mg/dL (ref 0.2–1.0)
pH, UA: 6.5 (ref 5.0–7.5)

## 2021-04-25 LAB — MICROSCOPIC EXAMINATION

## 2021-04-25 NOTE — Telephone Encounter (Signed)
Pt stated she seen her result for her urine culture was abnormal on mychart but no one from the office has called her about it or advised her of what to do/ she asked to speak with someone today due to still experiencing pain / and wanted to get some info before close of office today /please advise  ? ? ?Please advise ?

## 2021-04-26 LAB — URINE CULTURE

## 2021-04-26 NOTE — Telephone Encounter (Signed)
Attempted to contact patient no answer unable to LVM  °

## 2021-04-26 NOTE — Telephone Encounter (Signed)
Patient made aware of results and verbalized understanding.  

## 2021-04-26 NOTE — Telephone Encounter (Signed)
Looks normal pl let pt know thnx. To fu with urology

## 2021-04-30 ENCOUNTER — Ambulatory Visit (INDEPENDENT_AMBULATORY_CARE_PROVIDER_SITE_OTHER): Payer: Self-pay | Admitting: Obstetrics and Gynecology

## 2021-04-30 ENCOUNTER — Encounter: Payer: Self-pay | Admitting: *Deleted

## 2021-04-30 ENCOUNTER — Encounter: Payer: Self-pay | Admitting: Obstetrics and Gynecology

## 2021-04-30 VITALS — BP 90/60 | Ht 64.0 in | Wt 165.0 lb

## 2021-04-30 DIAGNOSIS — N921 Excessive and frequent menstruation with irregular cycle: Secondary | ICD-10-CM

## 2021-04-30 DIAGNOSIS — R1031 Right lower quadrant pain: Secondary | ICD-10-CM

## 2021-04-30 NOTE — Patient Instructions (Signed)
I value your feedback and you entrusting us with your care. If you get a [facility] patient survey, I would appreciate you taking the time to let us know about your experience today. Thank you! ? ? ?

## 2021-04-30 NOTE — Progress Notes (Signed)
? ? ?Brittney Skiff, Brittney Knox ? ? ?Chief Complaint  ?Patient presents with  ? Pelvic Pain  ?  Right side when pt is having cycle for almost a year now  ? ? ?HPI: ?     Brittney Knox is a 41 y.o. [id number] whose LMP was Patient's last menstrual period was 04/30/2021 (exact date)., presents today for constant "intense soreness" RLQ for 2 wks, not improving. Has had sx mid cycle for a few days past few months, but sx not improving this time. Has tried 800 mg ibup TID with tylenol without relief, no improvement with ice/heating pad. Hx of RTO cyst a few yrs ago. No urin, vag, GI sx; no fevers/LBP. ?Pt is s/p endometrial ablation a few yrs ago. Has spotting monthly, lasting 3-4 days, with minimal dysmen, no meds needed. Has been having mid cycle spotting for 3-4 days for the past 3-4 months along with RLQ pain, not improved with ibup 800 mg, but sx do resolve (unlike now).  ?Pt with hx of LT kidney stone. ?FH breast/ovar cancer but pt is MyRisk neg 2014. ?She is sex active, no pain/bleeding. No new partners.  ? ?Patient Active Problem List  ? Diagnosis Date Noted  ? Nephrolithiasis 04/24/2021  ? Right-sided low back pain without sciatica 04/24/2021  ? Vitamin D deficiency 02/23/2021  ? History of iron deficiency anemia 01/28/2021  ? Lab test positive for detection of COVID-19 virus 09/14/2020  ? Thrombocytopenia (HCC) 07/09/2020  ? Elevated low density lipoprotein (LDL) cholesterol level 07/09/2020  ? PMDD (premenstrual dysphoric disorder) 04/15/2019  ? H/O varicose veins of lower extremity 08/05/2017  ? Allergic rhinitis 12/18/2014  ? ? ?Past Surgical History:  ?Procedure Laterality Date  ? CESAREAN SECTION    ? X2  ? DILITATION & CURRETTAGE/HYSTROSCOPY WITH NOVASURE ABLATION N/A 02/11/2018  ? Procedure: DILATATION & CURETTAGE/HYSTEROSCOPY WITH ENDOMETRIAL ABLATION - MINERVA & ACCESS TO NOVASURE;  Surgeon: Nadara Mustard, MD;  Location: [facility] ORS;  Service: Gynecology;  Laterality: N/A;  ? TONSILLECTOMY    ? ? ?Family  History  ?Problem Relation Age of Onset  ? Arthritis Mother   ?     RA  ? Heart disease Mother   ? Hypothyroidism Mother   ? Heart disease Father   ?     A fib  ? Diabetes Father   ? Hypothyroidism Sister   ? Breast cancer Maternal Grandmother 42  ?     and 92  ? Diabetes Maternal Grandfather   ? Ovarian cancer Paternal Grandmother 21  ? Breast cancer Cousin   ? Breast cancer Maternal Aunt   ? ? ?Social History  ? ?Socioeconomic History  ? Marital status: Married  ?  Spouse name: Not on file  ? Number of children: Not on file  ? Years of education: Not on file  ? Highest education level: Not on file  ?Occupational History  ? Not on file  ?Tobacco Use  ? Smoking status: Never  ? Smokeless tobacco: Never  ?Vaping Use  ? Vaping Use: Never used  ?Substance and Sexual Activity  ? Alcohol use: Yes  ?  Comment: SOCIAL  ? Drug use: No  ? Sexual activity: Yes  ?  Birth control/protection: None  ?Other Topics Concern  ? Not on file  ?Social History Narrative  ? Not on file  ? ?Social Determinants of Health  ? ?Financial Resource Strain: Low Risk   ? Difficulty of Paying Living Expenses: Not hard at all  ?Food  Insecurity: No Food Insecurity  ? Worried About Programme researcher, broadcasting/film/videounning Out of Food in the Last Year: Never true  ? Ran Out of Food in the Last Year: Never true  ?Transportation Needs: No Transportation Needs  ? Lack of Transportation (Medical): No  ? Lack of Transportation (Non-Medical): No  ?Physical Activity: Insufficiently Active  ? Days of Exercise per Week: 3 days  ? Minutes of Exercise per Session: 30 min  ?Stress: Stress Concern Present  ? Feeling of Stress : To some extent  ?Social Connections: Moderately Isolated  ? Frequency of Communication with Friends and Family: More than three times a week  ? Frequency of Social Gatherings with Friends and Family: More than three times a week  ? Attends Religious Services: Never  ? Active Member of Clubs or Organizations: No  ? Attends BankerClub or Organization Meetings: Never  ? Marital  Status: Married  ?Intimate Partner Violence: Not At Risk  ? Fear of Current or Ex-Partner: No  ? Emotionally Abused: No  ? Physically Abused: No  ? Sexually Abused: No  ? ? ?Outpatient Medications Prior to Visit  ?Medication Sig Dispense Refill  ? acetaminophen-codeine (TYLENOL #3) 300-30 MG tablet Take 1 tablet by mouth every 12 (twelve) hours as needed for up to 15 days for moderate pain. 30 tablet 0  ? albuterol (VENTOLIN HFA) 108 (90 Base) MCG/ACT inhaler Inhale 2 puffs into the lungs every 6 (six) hours as needed for wheezing or shortness of breath. 8 g 0  ? citalopram (CELEXA) 10 MG tablet Take 1 tablet (10 mg total) by mouth daily. 90 tablet 4  ? diazepam (VALIUM) 5 MG tablet Take 5 mg by mouth 2 (two) times daily.    ? lidocaine (LIDODERM) 5 % Place 1 patch onto the skin daily. Remove & Discard patch within 12 hours or as directed by MD 30 patch 0  ? tamsulosin (FLOMAX) 0.4 MG CAPS capsule Take 1 capsule (0.4 mg total) by mouth daily. 30 capsule 1  ? ?No facility-administered medications prior to visit.  ? ? ? ? ?ROS: ? ?Review of Systems  ?Constitutional:  Negative for fever.  ?Gastrointestinal:  Negative for blood in stool, constipation, diarrhea, nausea and vomiting.  ?Genitourinary:  Positive for menstrual problem and pelvic pain. Negative for dyspareunia, dysuria, flank pain, frequency, hematuria, urgency, vaginal bleeding, vaginal discharge and vaginal pain.  ?Musculoskeletal:  Negative for back pain.  ?Skin:  Negative for rash.  ?BREAST: No symptoms ? ? ?OBJECTIVE:  ? ?Vitals:  ?BP 90/60   Ht 5\' 4"  (1.626 m)   Wt 165 lb (74.8 kg)   LMP 04/30/2021 (Exact Date)   BMI 28.32 kg/m?  ? ?Physical Exam ?Vitals reviewed.  ?Constitutional:   ?   Appearance: She is well-developed.  ?Pulmonary:  ?   Effort: Pulmonary effort is normal.  ?Abdominal:  ?   Palpations: Abdomen is soft.  ?   Tenderness: There is abdominal tenderness in the right lower quadrant. There is no guarding or rebound.  ?Genitourinary: ?    General: Normal vulva.  ?   Pubic Area: No rash.   ?   Labia:     ?   Right: No rash, tenderness or lesion.     ?   Left: No rash, tenderness or lesion.   ?   Vagina: Normal. No vaginal discharge, erythema or tenderness.  ?   Cervix: Normal.  ?   Uterus: Normal. Not enlarged and not tender.   ?   Adnexa: Left adnexa  normal.    ?   Right: Tenderness present. No mass.      ?   Left: No mass or tenderness.    ?Musculoskeletal:     ?   General: Normal range of motion.  ?   Cervical back: Normal range of motion.  ?Skin: ?   General: Skin is warm and dry.  ?Neurological:  ?   General: No focal deficit present.  ?   Mental Status: She is alert and oriented to person, place, and time.  ?Psychiatric:     ?   Mood and Affect: Mood normal.     ?   Behavior: Behavior normal.     ?   Thought Content: Thought content normal.     ?   Judgment: Judgment normal.  ? ? ?Assessment/Plan: ?RLQ abdominal pain - Plan: US PELVIC COMPLETE WITH TRANSVAGINAL; tender on exam; check GYN u/s, scheduled for tomorrow. Will f/u with results. Has tylenol with codeine Rx but afraid to take it. Can try it tonight to see if sx improve.  ? ?Breakthrough bleeding--s/p endometrial ablation. Will check GYN u/s since having BTB.  ? ? ? ? Return if symptoms worsen or fail to improve. ? ?Mackensey Bolte B. Jabreel Chimento, PA-C ?05/01/2021 ?10:26 AM ? ? ? ? ? ?

## 2021-05-01 ENCOUNTER — Ambulatory Visit
Admission: RE | Admit: 2021-05-01 | Discharge: 2021-05-01 | Disposition: A | Discharge status: Home / Self Care | Payer: Medicaid Other | Source: Ambulatory Visit | Attending: Obstetrics and Gynecology | Admitting: Obstetrics and Gynecology

## 2021-05-01 DIAGNOSIS — R1031 Right lower quadrant pain: Secondary | ICD-10-CM

## 2021-05-04 NOTE — Patient Instructions (Incomplete)
Abdominal Pain, Adult Many things can cause belly (abdominal) pain. Most times, belly pain is not dangerous. Many cases of belly pain can be watched and treated at home. Sometimes, though, belly pain is serious. Your doctor will try to find the cause of your belly pain. Follow these instructions at home:  Medicines Take over-the-counter and prescription medicines only as told by your doctor. Do not take medicines that help you poop (laxatives) unless told by your doctor. General instructions Watch your belly pain for any changes. Drink enough fluid to keep your pee (urine) pale yellow. Keep all follow-up visits as told by your doctor. This is important. Contact a doctor if: Your belly pain changes or gets worse. You are not hungry, or you lose weight without trying. You are having trouble pooping (constipated) or have watery poop (diarrhea) for more than 2-3 days. You have pain when you pee or poop. Your belly pain wakes you up at night. Your pain gets worse with meals, after eating, or with certain foods. You are vomiting and cannot keep anything down. You have a fever. You have blood in your pee. Get help right away if: Your pain does not go away as soon as your doctor says it should. You cannot stop vomiting. Your pain is only in areas of your belly, such as the right side or the left lower part of the belly. You have bloody or black poop, or poop that looks like tar. You have very bad pain, cramping, or bloating in your belly. You have signs of not having enough fluid or water in your body (dehydration), such as: Dark pee, very little pee, or no pee. Cracked lips. Dry mouth. Sunken eyes. Sleepiness. Weakness. You have trouble breathing or chest pain. Summary Many cases of belly pain can be watched and treated at home. Watch your belly pain for any changes. Take over-the-counter and prescription medicines only as told by your doctor. Contact a doctor if your belly pain  changes or gets worse. Get help right away if you have very bad pain, cramping, or bloating in your belly. This information is not intended to replace advice given to you by your health care provider. Make sure you discuss any questions you have with your health care provider. Document Revised: 05/03/2018 Document Reviewed: 05/03/2018 Elsevier Patient Education  2023 [employer]  

## 2021-05-07 ENCOUNTER — Ambulatory Visit: Payer: Medicaid Other | Admitting: Nurse Practitioner

## 2021-11-22 ENCOUNTER — Ambulatory Visit: Payer: Self-pay

## 2021-11-22 ENCOUNTER — Encounter: Payer: Self-pay | Admitting: Nurse Practitioner

## 2021-11-22 MED ORDER — ONDANSETRON HCL 4 MG PO TABS: 4.0000 mg | ORAL_TABLET | Freq: Three times a day (TID) | ORAL | 0 refills | Status: DC | PRN

## 2021-11-22 NOTE — Telephone Encounter (Signed)
Chief Complaint: Chest pain Symptoms: nausea Frequency: Ongoing x 4 days, today is worse Pertinent Negatives: Patient denies N/A Disposition: [x] ED /[] Urgent Care (no appt availability in office) / [] Appointment(In office/virtual)/ []  [facility]/ [] Home Care/ [] Refused Recommended Disposition /[]  Mobile Bus/ []  Follow-up with PCP Additional Notes: Patient says she's been taking Mylanta and Tums with no relief of the chest pain, she says it's more like heartburn than pain, non-radiating. Advised ED for evaluation, she says she'll go to [facility].   Reason for Disposition  [1] Chest pain lasts > 5 minutes AND [2] occurred in past 3 days (72 hours) (Exception: Feels exactly the same as previously diagnosed heartburn and has accompanying sour taste in mouth.)  Answer Assessment - Initial Assessment Questions 1. LOCATION: "Where does it hurt?"       Upper chest 2. RADIATION: "Does the pain go anywhere else?" (e.g., into neck, jaw, arms, back)     No 3. ONSET: "When did the chest pain begin?" (Minutes, hours or days)      4 days, today is worse 4. PATTERN: "Does the pain come and go, or has it been constant since it started?"  "Does it get worse with exertion?"      Constant 5. SEVERITY: "How bad is the pain?"  (e.g., Scale 1-10; mild, moderate, or severe)    - MILD (1-3): doesn't interfere with normal activities     - MODERATE (4-7): interferes with normal activities or awakens from sleep    - SEVERE (8-10): excruciating pain, unable to do any normal activities       5 6. OTHER SYMPTOMS: "Do you have any other symptoms?" (e.g., dizziness, nausea, vomiting, sweating, fever, difficulty breathing, cough)       Nausea  Protocols used: Chest Pain-A-AH

## 2021-11-22 NOTE — Telephone Encounter (Signed)
Noted, agree with ER.

## 2021-12-01 NOTE — Patient Instructions (Incomplete)
Sinus Pain  Sinus pain may occur when your sinuses become clogged or swollen. Sinuses are air-filled spaces in your skull that are behind the bones of your face and forehead. Sinus pain can range from mild to severe. What are the causes? Sinus pain can result from various conditions that affect the sinuses. Common causes include: Colds. Sinus infections. Allergies. What are the signs or symptoms? The main symptom of this condition is pain or pressure in your face, forehead, ears, or upper teeth. People who have sinus pain often have other symptoms, such as: Congested or runny nose. Fever. Inability to smell. Headache. Weather changes can make symptoms worse. How is this diagnosed? Your health care provider will diagnose this condition based on your symptoms and a physical exam. If you have pain that keeps coming back or does not go away, your health care provider may recommend more testing. This may include: Imaging tests, such as a CT scan or MRI, to check for problems with your sinuses. Examination of your sinuses using a thin tool with a camera that is inserted through your nose (endoscopy). How is this treated? Treatment for this condition depends on the cause. Sinus pain that is caused by a sinus infection may be treated with antibiotic medicine. Sinus pain that is caused by congestion may be helped by rinsing out (flushing) the nose and sinuses with saline solution. Sinus pain that is caused by allergies may be helped by allergy medicines (antihistamines) and medicated nasal sprays. Sinus surgery may be needed in some cases if other treatments do not help. Follow these instructions at home: General instructions If directed: Apply a warm, moist washcloth to your face to help relieve pain. Use a nasal saline wash. Follow the directions on the bottle or box. Hydrate and humidify Drink enough water to keep your urine clear or pale yellow. Staying hydrated will help to thin your  mucus. Use a humidifier if your home is dry. Inhale steam for 10-15 minutes, 3-4 times a day or as told by your health care provider. You can do this in the bathroom while a hot shower is running. Limit your exposure to cool or dry air. Medicines  Take over-the-counter and prescription medicines only as told by your health care provider. If you were prescribed an antibiotic medicine, take it as told by your health care provider. Do not stop taking the antibiotic even if you start to feel better. If you have congestion, use a nasal spray to help lessen pressure. Contact a health care provider if: You have sinus pain more than one time a week. You have sensitivity to light or sound. You develop a fever. You feel nauseous or you vomit. Your sinus pain or headache does not get better with treatment. Get help right away if: You have vision problems. You have sudden, severe pain in your face or head. You have a seizure. You are confused. You have a stiff neck. Summary Sinus pain occurs when your sinuses become clogged or swollen. Sinus pain can result from various conditions that affect the sinuses, such as a cold, a sinus infection, or an allergy. Treatment for this condition depends on the cause. It may include medicine, such as antibiotics or antihistamines. This information is not intended to replace advice given to you by your health care provider. Make sure you discuss any questions you have with your health care provider. Document Revised: 11/25/2020 Document Reviewed: 11/25/2020 Elsevier Patient Education  2023 [employer].  

## 2021-12-04 ENCOUNTER — Ambulatory Visit: Payer: Self-pay | Admitting: Nurse Practitioner

## 2022-01-26 NOTE — Patient Instructions (Incomplete)

## 2022-01-29 ENCOUNTER — Encounter: Payer: Medicaid Other | Admitting: Nurse Practitioner

## 2022-02-02 NOTE — Patient Instructions (Incomplete)

## 2022-02-07 ENCOUNTER — Encounter: Payer: Medicaid Other | Admitting: Nurse Practitioner

## 2022-02-07 DIAGNOSIS — Z23 Encounter for immunization: Secondary | ICD-10-CM

## 2022-02-07 DIAGNOSIS — F3281 Premenstrual dysphoric disorder: Secondary | ICD-10-CM

## 2022-02-07 DIAGNOSIS — Z0000 Encounter for general adult medical examination without abnormal findings: Secondary | ICD-10-CM

## 2022-02-07 DIAGNOSIS — D696 Thrombocytopenia, unspecified: Secondary | ICD-10-CM

## 2022-02-07 DIAGNOSIS — E7800 Pure hypercholesterolemia, unspecified: Secondary | ICD-10-CM

## 2022-02-07 DIAGNOSIS — Z862 Personal history of diseases of the blood and blood-forming organs and certain disorders involving the immune mechanism: Secondary | ICD-10-CM

## 2022-02-07 DIAGNOSIS — E559 Vitamin D deficiency, unspecified: Secondary | ICD-10-CM

## 2022-02-09 NOTE — Patient Instructions (Signed)
Preventing Iron Deficiency Anemia, Adult Iron deficiency is having a lack of iron in the body. Iron is an important mineral that your body needs to build healthy red blood cells. Iron deficiency anemia is a condition in which the concentration of red blood cells or hemoglobin in the blood is below normal because of too little iron. Hemoglobin is a substance in red blood cells that carries oxygen to the body's tissues. You may develop iron deficiency anemia due to: Blood loss from an injury or condition such as Crohn's disease. Your body being unable to properly absorb iron and use it to create red blood cells. Lack of iron in your diet. You can prevent iron deficiency anemia by making certain changes to your diet and lifestyle. How can this condition affect me? If not treated, iron deficiency anemia can lead to serious health complications, including: Long-term (chronic) tiredness or fatigue. Shortness of breath with activity. Abnormal heart rhythms. Heart failure. Uncomfortable sensations and an overwhelming urge to move your legs (restless legs syndrome). A weakened disease-fighting system (immune system). What can increase my risk? It is important to know whether you are at risk for iron deficiency anemia. Ask your health care provider if you need a blood test to measure your iron or red blood cells. You may have a higher risk for iron deficiency anemia if: You are female and one of the following applies: You have heavy menstrual periods. You are pregnant. You are breastfeeding. You have had bypass surgery for weight loss. You have a digestive disorder such as Crohn's disease, irritable bowel syndrome, or celiac disease. You regularly take antacids or acid-lowering medicines. You follow a vegetarian or vegan diet. What actions can I take to lower my risk? Nutrition  Eat foods that are high in iron, such as: Red meat, especially liver and beef. Poultry. Seafood. Dried fruit. Prune  juice. Nuts. Pumpkin seeds. Beans. Leafy green vegetables. Molasses. Tofu. Look for foods that have added iron (are fortified). Many cereals and breads are iron fortified. Eat foods that contain vitamin C along with iron-rich foods, preferably in the same meal. Vitamin C increases your body's ability to absorb iron. Foods high in vitamin C include: Citrus fruits, such as lemons, oranges, and grapefruits. Berries. Bell peppers. Tomatoes. Broccoli. Do not follow a diet that is very low in fat or very high in fiber. Do not drink very large amounts of milk, tea, or coffee. General tips Limit your use of antacids or acid-lowering medicines. Work with your health care provider to manage conditions that can cause iron deficiency. Ask your health care provider if a multivitamin or iron supplement is right for you. Take over-the-counter and prescription medicines only as told by your health care provider. Keep all follow-up visits. Where to find more information Learn more about preventing iron deficiency from: National Heart, Lung, and Blood Institute: www.nhlbi.nih.gov American Society of Hematology: www.hematology.org Contact a health care provider if: You develop symptoms of iron deficiency, including: Fatigue. Headache. Pale skin, lips, and nail beds. Poor appetite. Weakness. Summary Iron deficiency anemia is a condition in which the concentration of red blood cells or hemoglobin in the blood is below normal because of too little iron. You can help prevent iron deficiency anemia by eating more iron-rich foods, such as red meat, poultry, or tofu. Look for foods that have added iron (are fortified), such as cereals. Eat foods that contain vitamin C along with iron-rich foods, preferably in the same meal. Vitamin C increases the body's ability   to absorb iron. Ask your health care provider about your risk for iron deficiency anemia and whether a multivitamin or iron supplement may be  right for you. This information is not intended to replace advice given to you by your health care provider. Make sure you discuss any questions you have with your health care provider. Document Revised: 01/30/2021 Document Reviewed: 01/30/2021 Elsevier Patient Education  2023 [employer] Inc.  

## 2022-02-12 ENCOUNTER — Ambulatory Visit (INDEPENDENT_AMBULATORY_CARE_PROVIDER_SITE_OTHER): Payer: BC Managed Care – PPO | Admitting: Nurse Practitioner

## 2022-02-12 ENCOUNTER — Encounter: Payer: Self-pay | Admitting: Nurse Practitioner

## 2022-02-12 VITALS — BP 111/74 | HR 83 | Temp 97.9°F | Ht 64.02 in | Wt 160.9 lb

## 2022-02-12 DIAGNOSIS — F3281 Premenstrual dysphoric disorder: Secondary | ICD-10-CM | POA: Diagnosis not present

## 2022-02-12 DIAGNOSIS — E7800 Pure hypercholesterolemia, unspecified: Secondary | ICD-10-CM | POA: Diagnosis not present

## 2022-02-12 DIAGNOSIS — Z862 Personal history of diseases of the blood and blood-forming organs and certain disorders involving the immune mechanism: Secondary | ICD-10-CM

## 2022-02-12 DIAGNOSIS — Z0000 Encounter for general adult medical examination without abnormal findings: Secondary | ICD-10-CM | POA: Diagnosis not present

## 2022-02-12 DIAGNOSIS — D696 Thrombocytopenia, unspecified: Secondary | ICD-10-CM

## 2022-02-12 DIAGNOSIS — Z23 Encounter for immunization: Secondary | ICD-10-CM

## 2022-02-12 DIAGNOSIS — E559 Vitamin D deficiency, unspecified: Secondary | ICD-10-CM

## 2022-02-12 MED ORDER — BUSPIRONE HCL 5 MG PO TABS
5.0000 mg | ORAL_TABLET | Freq: Two times a day (BID) | ORAL | 4 refills | Status: DC
Start: 2022-02-12 — End: 2022-08-15

## 2022-02-12 MED ORDER — FLUOXETINE HCL 20 MG PO TABS: 20.0000 mg | ORAL_TABLET | Freq: Every day | ORAL | 4 refills | Status: DC

## 2022-02-12 NOTE — Assessment & Plan Note (Signed)
Noted past labs, recheck today and initiate medication as needed.  Lipid panel and CMP today.

## 2022-02-12 NOTE — Progress Notes (Signed)
BP 111/74   Pulse 83 Comment: apical  Temp 97.9 F (36.6 C) (Oral)   Ht 5' 4.02" (1.626 m)   Wt 160 lb 14.4 oz (73 kg)   SpO2 97%   BMI 27.60 kg/m    Subjective:    Patient ID: Brittney Knox, female    DOB: 1980/04/28, 42 y.o.   MRN: [id number]  HPI: Brittney Knox is a 42 y.o. female presenting on 02/12/2022 for comprehensive medical examination. Current medical complaints include:none  She currently lives with: significant other Menopausal Symptoms: no  DEPRESSION Currently taking Celexa, which she feels does not work as well as Prozac did.  Moods change around cycles with more anxiety. Mood status: exacerbated Satisfied with current treatment?: yes Symptom severity: moderate  Duration of current treatment : chronic Side effects: no Medication compliance: good compliance Psychotherapy/counseling: yes in the past Depressed mood: no Anxious mood: yes around cycles Anhedonia: no Significant weight loss or gain: no Insomnia: yes hard to fall asleep -- does not take anything for this Fatigue: no Feelings of worthlessness or guilt: no Impaired concentration/indecisiveness: no Suicidal ideations: no Hopelessness: no Crying spells: no    02/12/2022    2:58 PM 04/24/2021    4:41 PM 01/28/2021    2:11 PM 09/14/2020   11:06 AM 05/09/2020    1:45 PM  Depression screen PHQ 2/9  Decreased Interest 1 0  0 0  Down, Depressed, Hopeless 1 0  0 0  PHQ - 2 Score 2 0  0 0  Altered sleeping 3 1 2  0   Tired, decreased energy 2 1 0 0   Change in appetite 0 0 0 0   Feeling bad or failure about yourself  0 0 0 0   Trouble concentrating 2 0 2 0   Moving slowly or fidgety/restless 0 0 2 0   Suicidal thoughts 0 0 0 0   PHQ-9 Score 9 2  0   Difficult doing work/chores Somewhat difficult Not difficult at all           04/15/2019    9:34 AM 05/09/2020    1:45 PM 09/14/2020   11:06 AM 04/24/2021    4:41 PM 02/12/2022    2:58 PM  Fall Risk  Falls in the past year? 0 0 0 0 0  Was there an  injury with Fall? 0 0 0 0 0  Fall Risk Category Calculator 0 0 0 0 0  Fall Risk Category (Retired) Low Low Low Low   (RETIRED) Patient Fall Risk Level  Low fall risk     Patient at Risk for Falls Due to  No Fall Risks  No Fall Risks No Fall Risks  Fall risk Follow up  Falls evaluation completed Falls evaluation completed Falls evaluation completed Falls evaluation completed    Functional Status Survey: Is the patient deaf or have difficulty hearing?: No Does the patient have difficulty seeing, even when wearing glasses/contacts?: No Does the patient have difficulty concentrating, remembering, or making decisions?: No Does the patient have difficulty walking or climbing stairs?: No Does the patient have difficulty dressing or bathing?: No Does the patient have difficulty doing errands alone such as visiting a doctor's office or shopping?: No    Past Medical History:  Past Medical History:  Diagnosis Date   Anemia    BRCA negative 12/2012   MyRisk neg; CDKN2A VUS   Family history of breast cancer 12/2012   IBIS=15%   Family history of ovarian cancer  GERD (gastroesophageal reflux disease)    OCC -NO MEDS   LGSIL on Pap smear of cervix     Surgical History:  Past Surgical History:  Procedure Laterality Date   CESAREAN SECTION     X2   DILITATION & CURRETTAGE/HYSTROSCOPY WITH NOVASURE ABLATION N/A 02/11/2018   Procedure: DILATATION & CURETTAGE/HYSTEROSCOPY WITH ENDOMETRIAL ABLATION - MINERVA & ACCESS TO NOVASURE;  Surgeon: Nadara Mustard, MD;  Location: [facility] ORS;  Service: Gynecology;  Laterality: N/A;   TONSILLECTOMY      Medications:  Current Outpatient Medications on File Prior to Visit  Medication Sig   albuterol (VENTOLIN HFA) 108 (90 Base) MCG/ACT inhaler Inhale 2 puffs into the lungs every 6 (six) hours as needed for wheezing or shortness of breath.   ondansetron (ZOFRAN) 4 MG tablet Take 1 tablet (4 mg total) by mouth every 8 (eight) hours as needed for nausea or  vomiting.   No current facility-administered medications on file prior to visit.    Allergies:  Allergies  Allergen Reactions   Morphine And Related Itching    Social History:  Social History   Socioeconomic History   Marital status: Married    Spouse name: Not on file   Number of children: Not on file   Years of education: Not on file   Highest education level: Not on file  Occupational History   Not on file  Tobacco Use   Smoking status: Never   Smokeless tobacco: Never  Vaping Use   Vaping Use: Never used  Substance and Sexual Activity   Alcohol use: Yes    Comment: SOCIAL   Drug use: No   Sexual activity: Yes    Birth control/protection: None  Other Topics Concern   Not on file  Social History Narrative   Not on file   Social Determinants of Health   Financial Resource Strain: Low Risk  (01/28/2021)   Overall Financial Resource Strain (CARDIA)    Difficulty of Paying Living Expenses: Not hard at all  Food Insecurity: No Food Insecurity (01/28/2021)   Hunger Vital Sign    Worried About Running Out of Food in the Last Year: Never true    Ran Out of Food in the Last Year: Never true  Transportation Needs: No Transportation Needs (01/28/2021)   PRAPARE - Administrator, Civil Service (Medical): No    Lack of Transportation (Non-Medical): No  Physical Activity: Insufficiently Active (01/28/2021)   Exercise Vital Sign    Days of Exercise per Week: 3 days    Minutes of Exercise per Session: 30 min  Stress: Stress Concern Present (01/28/2021)   [employer] of Occupational Health - Occupational Stress Questionnaire    Feeling of Stress : To some extent  Social Connections: Moderately Isolated (01/28/2021)   Social Connection and Isolation Panel [NHANES]    Frequency of Communication with Friends and Family: More than three times a week    Frequency of Social Gatherings with Friends and Family: More than three times a week    Attends Religious  Services: Never    Database administrator or Organizations: No    Attends Banker Meetings: Never    Marital Status: Married  Catering manager Violence: Not At Risk (01/28/2021)   Humiliation, Afraid, Rape, and Kick questionnaire    Fear of Current or Ex-Partner: No    Emotionally Abused: No    Physically Abused: No    Sexually Abused: No   Social History  Tobacco Use  Smoking Status Never  Smokeless Tobacco Never   Social History   Substance and Sexual Activity  Alcohol Use Yes   Comment: SOCIAL    Family History:  Family History  Problem Relation Age of Onset   Arthritis Mother        RA   Heart disease Mother    Hypothyroidism Mother    Heart disease Father        A fib   Diabetes Father    Hypothyroidism Sister    Breast cancer Maternal Grandmother 78       and 33   Diabetes Maternal Grandfather    Ovarian cancer Paternal Grandmother 72   Breast cancer Cousin    Breast cancer Maternal Aunt     Past medical history, surgical history, medications, allergies, family history and social history reviewed with patient today and changes made to appropriate areas of the chart.   ROS All other ROS negative except what is listed above and in the HPI.      Objective:    BP 111/74   Pulse 83 Comment: apical  Temp 97.9 F (36.6 C) (Oral)   Ht 5' 4.02" (1.626 m)   Wt 160 lb 14.4 oz (73 kg)   SpO2 97%   BMI 27.60 kg/m   Wt Readings from Last 3 Encounters:  02/12/22 160 lb 14.4 oz (73 kg)  04/30/21 165 lb (74.8 kg)  04/24/21 163 lb 9.6 oz (74.2 kg)    Physical Exam Vitals and nursing note reviewed. Exam conducted with a chaperone present.  Constitutional:      General: She is awake. She is not in acute distress.    Appearance: She is well-developed and well-groomed. She is not ill-appearing or toxic-appearing.  HENT:     Head: Normocephalic and atraumatic.     Right Ear: Hearing, tympanic membrane, ear canal and external ear normal. No drainage.      Left Ear: Hearing, tympanic membrane, ear canal and external ear normal. No drainage.     Nose: Nose normal.     Right Sinus: No maxillary sinus tenderness or frontal sinus tenderness.     Left Sinus: No maxillary sinus tenderness or frontal sinus tenderness.     Mouth/Throat:     Mouth: Mucous membranes are moist.     Pharynx: Oropharynx is clear. Uvula midline. No pharyngeal swelling, oropharyngeal exudate or posterior oropharyngeal erythema.  Eyes:     General: Lids are normal.        Right eye: No discharge.        Left eye: No discharge.     Extraocular Movements: Extraocular movements intact.     Conjunctiva/sclera: Conjunctivae normal.     Pupils: Pupils are equal, round, and reactive to light.     Visual Fields: Right eye visual fields normal and left eye visual fields normal.  Neck:     Thyroid: No thyromegaly.     Vascular: No carotid bruit.     Trachea: Trachea normal.  Cardiovascular:     Rate and Rhythm: Normal rate and regular rhythm.     Heart sounds: Normal heart sounds. No murmur heard.    No gallop.  Pulmonary:     Effort: Pulmonary effort is normal. No accessory muscle usage or respiratory distress.     Breath sounds: Normal breath sounds.  Chest:  Breasts:    Right: Normal.     Left: Normal.  Abdominal:     General: Bowel sounds are normal.  Palpations: Abdomen is soft. There is no hepatomegaly or splenomegaly.     Tenderness: There is no abdominal tenderness.  Musculoskeletal:        General: Normal range of motion.     Cervical back: Normal range of motion and neck supple.     Right lower leg: No edema.     Left lower leg: No edema.  Lymphadenopathy:     Head:     Right side of head: No submental, submandibular, tonsillar, preauricular or posterior auricular adenopathy.     Left side of head: No submental, submandibular, tonsillar, preauricular or posterior auricular adenopathy.     Cervical: No cervical adenopathy.     Upper Body:     Right  upper body: No supraclavicular, axillary or pectoral adenopathy.     Left upper body: No supraclavicular, axillary or pectoral adenopathy.  Skin:    General: Skin is warm and dry.     Capillary Refill: Capillary refill takes less than 2 seconds.     Findings: No rash.  Neurological:     Mental Status: She is alert and oriented to person, place, and time.     Gait: Gait is intact.     Deep Tendon Reflexes: Reflexes are normal and symmetric.     Reflex Scores:      Brachioradialis reflexes are 2+ on the right side and 2+ on the left side.      Patellar reflexes are 2+ on the right side and 2+ on the left side. Psychiatric:        Attention and Perception: Attention normal.        Mood and Affect: Mood normal.        Speech: Speech normal.        Behavior: Behavior normal. Behavior is cooperative.        Thought Content: Thought content normal.        Judgment: Judgment normal.     Results for orders placed or performed in visit on 04/24/21  Urine Culture   Specimen: Urine   UR  Result Value Ref Range   Urine Culture, Routine Final report    Organism ID, Bacteria Comment   Microscopic Examination   Urine  Result Value Ref Range   WBC, UA 0-5 0 - 5 /hpf   RBC, Urine 0-2 0 - 2 /hpf   Epithelial Cells (non renal) 0-10 0 - 10 /hpf   Bacteria, UA Few (A) None seen/Few  Urinalysis, Routine w reflex microscopic  Result Value Ref Range   Specific Gravity, UA 1.025 1.005 - 1.030   pH, UA 6.5 5.0 - 7.5   Color, UA Yellow Yellow   Appearance Ur Clear Clear   Leukocytes,UA 1+ (A) Negative   Protein,UA Negative Negative/Trace   Glucose, UA Negative Negative   Ketones, UA 2+ (A) Negative   RBC, UA Negative Negative   Bilirubin, UA Negative Negative   Urobilinogen, Ur 0.2 0.2 - 1.0 mg/dL   Nitrite, UA Negative Negative   Microscopic Examination See below:       Assessment & Plan:   Problem List Items Addressed This Visit       Hematopoietic and Hemostatic   Thrombocytopenia  ([facility]) - Primary    Recheck CBC today -- ongoing since 2017 = 120-137 on labs.      Relevant Orders   CBC with Differential/Platelet     Other   Elevated low density lipoprotein (LDL) cholesterol level    Noted past labs, recheck today  and initiate medication as needed.  Lipid panel and CMP today.      Relevant Orders   Comprehensive metabolic panel   Lipid Panel w/o Chol/HDL Ratio   History of iron deficiency anemia    History of on past labs -- check CBC, iron, ferritin today.      Relevant Orders   CBC with Differential/Platelet   Iron Binding Cap (TIBC)([facility])   Ferritin   PMDD (premenstrual dysphoric disorder)    Chronic, ongoing.  At this time stop Celexa and restart Prozac 20 MG daily + add on Buspar to take 5 MG BID to start taking one week prior to cycle and then take through cycle and stop to assist with anxiety.  Denies SI/HI.  Return in 6 months for follow-up, sooner if worsening mood.      Relevant Medications   FLUoxetine (PROZAC) 20 MG tablet   busPIRone (BUSPAR) 5 MG tablet   Other Relevant Orders   TSH   Vitamin D deficiency    Noted on past labs, recheck today and initiate supplement as needed.        Relevant Orders   VITAMIN D 25 Hydroxy (Vit-D Deficiency, Fractures)   Other Visit Diagnoses     Encounter for annual physical exam       Annual physical today with labs and health maintenance reviewed, discussed with patient.        Follow up plan: Return in about 6 months (around 08/13/2022) for PMDD.   LABORATORY TESTING:  - Pap smear: up to date  IMMUNIZATIONS:   - Tdap: Tetanus vaccination status reviewed: last tetanus booster within 10 years. - Influenza: Up to date - Pneumovax: Not applicable - Prevnar: Not applicable - COVID: Up to date - HPV: Not applicable - Shingrix vaccine: Not applicable  SCREENING: -Mammogram: Not applicable  - Colonoscopy: Not applicable  - Bone Density: Not applicable  -Hearing Test: Not  applicable  -Spirometry: Not applicable   PATIENT COUNSELING:   Advised to take 1 mg of folate supplement per day if capable of pregnancy.   Sexuality: Discussed sexually transmitted diseases, partner selection, use of condoms, avoidance of unintended pregnancy  and contraceptive alternatives.   Advised to avoid cigarette smoking.  I discussed with the patient that most people either abstain from alcohol or drink within safe limits (<=14/week and <=4 drinks/occasion for males, <=7/weeks and <= 3 drinks/occasion for females) and that the risk for alcohol disorders and other health effects rises proportionally with the number of drinks per week and how often a drinker exceeds daily limits.  Discussed cessation/primary prevention of drug use and availability of treatment for abuse.   Diet: Encouraged to adjust caloric intake to maintain  or achieve ideal body weight, to reduce intake of dietary saturated fat and total fat, to limit sodium intake by avoiding high sodium foods and not adding table salt, and to maintain adequate dietary potassium and calcium preferably from fresh fruits, vegetables, and low-fat dairy products.    Stressed the importance of regular exercise  Injury prevention: Discussed safety belts, safety helmets, smoke detector, smoking near bedding or upholstery.   Dental health: Discussed importance of regular tooth brushing, flossing, and dental visits.    NEXT PREVENTATIVE PHYSICAL DUE IN 1 YEAR. Return in about 6 months (around 08/13/2022) for PMDD.

## 2022-02-12 NOTE — Assessment & Plan Note (Signed)
History of on past labs -- check CBC, iron, ferritin today.

## 2022-02-12 NOTE — Assessment & Plan Note (Signed)
Noted on past labs, recheck today and initiate supplement as needed.

## 2022-02-12 NOTE — Assessment & Plan Note (Signed)
Chronic, ongoing.  At this time stop Celexa and restart Prozac 20 MG daily + add on Buspar to take 5 MG BID to start taking one week prior to cycle and then take through cycle and stop to assist with anxiety.  Denies SI/HI.  Return in 6 months for follow-up, sooner if worsening mood.

## 2022-02-12 NOTE — Assessment & Plan Note (Signed)
Recheck CBC today -- ongoing since 2017 = 120-137 on labs.

## 2022-02-13 LAB — CBC WITH DIFFERENTIAL/PLATELET
Basophils Absolute: 0.1 10*3/uL (ref 0.0–0.2)
Basos: 1 %
EOS (ABSOLUTE): 0.1 10*3/uL (ref 0.0–0.4)
Eos: 2 %
Hematocrit: 43.7 % (ref 34.0–46.6)
Hemoglobin: 15.1 g/dL (ref 11.1–15.9)
Immature Grans (Abs): 0 10*3/uL (ref 0.0–0.1)
Immature Granulocytes: 0 %
Lymphocytes Absolute: 1.2 10*3/uL (ref 0.7–3.1)
Lymphs: 16 %
MCH: 34.4 pg — ABNORMAL HIGH (ref 26.6–33.0)
MCHC: 34.6 g/dL (ref 31.5–35.7)
MCV: 100 fL — ABNORMAL HIGH (ref 79–97)
Monocytes Absolute: 0.5 10*3/uL (ref 0.1–0.9)
Monocytes: 6 %
Neutrophils Absolute: 6 10*3/uL (ref 1.4–7.0)
Neutrophils: 75 %
Platelets: 155 10*3/uL (ref 150–450)
RBC: 4.39 x10E6/uL (ref 3.77–5.28)
RDW: 11.7 % (ref 11.7–15.4)
WBC: 8 10*3/uL (ref 3.4–10.8)

## 2022-02-13 LAB — IRON AND TIBC
Iron Saturation: 19 % (ref 15–55)
Iron: 70 ug/dL (ref 27–159)
Total Iron Binding Capacity: 362 ug/dL (ref 250–450)
UIBC: 292 ug/dL (ref 131–425)

## 2022-02-13 LAB — COMPREHENSIVE METABOLIC PANEL
ALT: 12 IU/L (ref 0–32)
AST: 18 IU/L (ref 0–40)
Albumin/Globulin Ratio: 2.3 — ABNORMAL HIGH (ref 1.2–2.2)
Albumin: 5.2 g/dL — ABNORMAL HIGH (ref 3.9–4.9)
Alkaline Phosphatase: 65 IU/L (ref 44–121)
BUN/Creatinine Ratio: 24 — ABNORMAL HIGH (ref 9–23)
BUN: 15 mg/dL (ref 6–20)
Bilirubin Total: 1 mg/dL (ref 0.0–1.2)
CO2: 22 mmol/L (ref 20–29)
Calcium: 9.7 mg/dL (ref 8.7–10.2)
Chloride: 97 mmol/L (ref 96–106)
Creatinine, Ser: 0.62 mg/dL (ref 0.57–1.00)
Globulin, Total: 2.3 g/dL (ref 1.5–4.5)
Glucose: 84 mg/dL (ref 70–99)
Potassium: 4.4 mmol/L (ref 3.5–5.2)
Sodium: 137 mmol/L (ref 134–144)
Total Protein: 7.5 g/dL (ref 6.0–8.5)
eGFR: 116 mL/min/{1.73_m2} (ref >59)

## 2022-02-13 LAB — TSH: TSH: 1.32 u[IU]/mL (ref 0.450–4.500)

## 2022-02-13 LAB — LIPID PANEL W/O CHOL/HDL RATIO
Cholesterol, Total: 227 mg/dL — ABNORMAL HIGH (ref 100–199)
HDL: 61 mg/dL (ref >39)
LDL Chol Calc (NIH): 148 mg/dL — ABNORMAL HIGH (ref 0–99)
Triglycerides: 100 mg/dL (ref 0–149)
VLDL Cholesterol Cal: 18 mg/dL (ref 5–40)

## 2022-02-13 LAB — FERRITIN: Ferritin: 166 ng/mL — ABNORMAL HIGH (ref 15–150)

## 2022-02-13 LAB — VITAMIN D 25 HYDROXY (VIT D DEFICIENCY, FRACTURES): Vit D, 25-Hydroxy: 23.1 ng/mL — ABNORMAL LOW (ref 30.0–100.0)

## 2022-02-13 NOTE — Progress Notes (Signed)
Contacted via MyChart   Good afternoon Tyrea, your labs have returned and overall are stable with exception of Vitamin D and cholesterol levels.  Vitamin D remains low, please ensure you are taking Vitamin D3 2000 units daily for overall bone health.  Your cholesterol is still high, but continued recommendations to make lifestyle changes. Your LDL is above normal. The LDL is the bad cholesterol. Over time and in combination with inflammation and other factors, this contributes to plaque which in turn may lead to stroke and/or heart attack down the road. Sometimes high LDL is primarily genetic, and people might be eating all the right foods but still have high numbers. Other times, there is room for improvement in one's diet and eating healthier can bring this number down and potentially reduce one's risk of heart attack and/or stroke.   To reduce your LDL, Remember - more fruits and vegetables, more fish, and limit red meat and dairy products. More soy, nuts, beans, barley, lentils, oats and plant sterol ester enriched margarine instead of butter. I also encourage eliminating sugar and processed food. Remember, shop on the outside of the grocery store and visit your [employer]. If you would like to talk with me about dietary changes for your cholesterol, please let me know. We should recheck your cholesterol in 12 months.  Any questions? Keep being stellar!!  Thank you for allowing me to participate in your care.  I appreciate you. Kindest regards, Brookley Spitler

## 2022-06-29 NOTE — Progress Notes (Deleted)
PCP:  Marjie Skiff, NP   No chief complaint on file.    HPI:      [name] is a 42 y.o. G2P2002 whose LMP was No LMP recorded. Patient has had an ablation., presents today for her annual examination.  Her menses are {norm/abn:715}, lasting {number: 22536} days.  Dysmenorrhea {dysmen:716}. She {does:18564} have intermenstrual bleeding. S/p endeomterial alation.  Hx of ovar cysts  Sex activity: {sex active: 315163}.  Last Pap: 08/05/17 Results were: no abnormalities /neg HPV DNA  Hx of STDs: {STD hx:14358}  Last mammogram: {date:304500300}  Results were: {norm/abn:13465} There is no FH of breast cancer. There is no FH of ovarian cancer. The patient {does:18564} do self-breast exams. Pt is MyRisk neg except CDKN2A VUS 2014. IBIS=15%  Tobacco use: {tob:20664} Alcohol use: {Alcohol:11675} No drug use.  Exercise: {exercise:31265}  She {does:18564} get adequate calcium and Vitamin D in her diet.  Patient Active Problem List   Diagnosis Date Noted   Nephrolithiasis 04/24/2021   Vitamin D deficiency 02/23/2021   History of iron deficiency anemia 01/28/2021   Thrombocytopenia ([facility]) 07/09/2020   Elevated low density lipoprotein (LDL) cholesterol level 07/09/2020   PMDD (premenstrual dysphoric disorder) 04/15/2019   H/O varicose veins of lower extremity 08/05/2017   Allergic rhinitis 12/18/2014    Past Surgical History:  Procedure Laterality Date   CESAREAN SECTION     X2   DILITATION & CURRETTAGE/HYSTROSCOPY WITH NOVASURE ABLATION N/A 02/11/2018   Procedure: DILATATION & CURETTAGE/HYSTEROSCOPY WITH ENDOMETRIAL ABLATION - MINERVA & ACCESS TO NOVASURE;  Surgeon: Nadara Mustard, MD;  Location: [facility];  Service: Gynecology;  Laterality: N/A;   TONSILLECTOMY      Family History  Problem Relation Age of Onset   Arthritis Mother        RA   Heart disease Mother    Hypothyroidism Mother    Heart disease Father        A fib   Diabetes Father    Hypothyroidism  Sister    Breast cancer Maternal Grandmother 36       and 66   Diabetes Maternal Grandfather    Ovarian cancer Paternal Grandmother 79   Breast cancer Cousin    Breast cancer Maternal Aunt     Social History   Socioeconomic History   Marital status: Married    Spouse name: Not on file   Number of children: Not on file   Years of education: Not on file   Highest education level: Not on file  Occupational History   Not on file  Tobacco Use   Smoking status: Never   Smokeless tobacco: Never  Vaping Use   Vaping Use: Never used  Substance and Sexual Activity   Alcohol use: Yes    Comment: SOCIAL   Drug use: No   Sexual activity: Yes    Birth control/protection: None  Other Topics Concern   Not on file  Social History Narrative   Not on file   Social Determinants of Health   Financial Resource Strain: Low Risk  (01/28/2021)   Overall Financial Resource Strain (CARDIA)    Difficulty of Paying Living Expenses: Not hard at all  Food Insecurity: No Food Insecurity (01/28/2021)   Hunger Vital Sign    Worried About Running Out of Food in the Last Year: Never true    Ran Out of Food in the Last Year: Never true  Transportation Needs: No Transportation Needs (01/28/2021)   PRAPARE - Transportation  Lack of Transportation (Medical): No    Lack of Transportation (Non-Medical): No  Physical Activity: Insufficiently Active (01/28/2021)   Exercise Vital Sign    Days of Exercise per Week: 3 days    Minutes of Exercise per Session: 30 min  Stress: Stress Concern Present (01/28/2021)   [employer] of Occupational Health - Occupational Stress Questionnaire    Feeling of Stress : To some extent  Social Connections: Moderately Isolated (01/28/2021)   Social Connection and Isolation Panel [NHANES]    Frequency of Communication with Friends and Family: More than three times a week    Frequency of Social Gatherings with Friends and Family: More than three times a week    Attends  Religious Services: Never    Database administrator or Organizations: No    Attends Banker Meetings: Never    Marital Status: Married  Catering manager Violence: Not At Risk (01/28/2021)   Humiliation, Afraid, Rape, and Kick questionnaire    Fear of Current or Ex-Partner: No    Emotionally Abused: No    Physically Abused: No    Sexually Abused: No     Current Outpatient Medications:    albuterol (VENTOLIN HFA) 108 (90 Base) MCG/ACT inhaler, Inhale 2 puffs into the lungs every 6 (six) hours as needed for wheezing or shortness of breath., Disp: 8 g, Rfl: 0   busPIRone (BUSPAR) 5 MG tablet, Take 1 tablet (5 mg total) by mouth 2 (two) times daily., Disp: 180 tablet, Rfl: 4   FLUoxetine (PROZAC) 20 MG tablet, Take 1 tablet (20 mg total) by mouth daily., Disp: 90 tablet, Rfl: 4   ondansetron (ZOFRAN) 4 MG tablet, Take 1 tablet (4 mg total) by mouth every 8 (eight) hours as needed for nausea or vomiting., Disp: 20 tablet, Rfl: 0     ROS:  Review of Systems BREAST: No symptoms   Objective: There were no vitals taken for this visit.   OBGyn Exam  Results: No results found for this or any previous visit (from the past 24 hour(s)).  Assessment/Plan: No diagnosis found.  No orders of the defined types were placed in this encounter.            GYN counsel {counseling: 16159}     F/U  No follow-ups on file.  Pamela Maddy B. Xiana Carns, PA-C 06/29/2022 7:38 PM

## 2022-06-30 ENCOUNTER — Ambulatory Visit: Payer: BC Managed Care – PPO | Admitting: Obstetrics and Gynecology

## 2022-06-30 DIAGNOSIS — Z124 Encounter for screening for malignant neoplasm of cervix: Secondary | ICD-10-CM

## 2022-06-30 DIAGNOSIS — Z803 Family history of malignant neoplasm of breast: Secondary | ICD-10-CM

## 2022-06-30 DIAGNOSIS — Z1151 Encounter for screening for human papillomavirus (HPV): Secondary | ICD-10-CM

## 2022-06-30 DIAGNOSIS — Z01419 Encounter for gynecological examination (general) (routine) without abnormal findings: Secondary | ICD-10-CM

## 2022-08-15 ENCOUNTER — Telehealth (INDEPENDENT_AMBULATORY_CARE_PROVIDER_SITE_OTHER): Payer: BC Managed Care – PPO | Admitting: Nurse Practitioner

## 2022-08-15 ENCOUNTER — Encounter: Payer: Self-pay | Admitting: Nurse Practitioner

## 2022-08-15 DIAGNOSIS — F32A Depression, unspecified: Secondary | ICD-10-CM

## 2022-08-15 DIAGNOSIS — G4700 Insomnia, unspecified: Secondary | ICD-10-CM | POA: Insufficient documentation

## 2022-08-15 DIAGNOSIS — F419 Anxiety disorder, unspecified: Secondary | ICD-10-CM | POA: Diagnosis not present

## 2022-08-15 DIAGNOSIS — F3281 Premenstrual dysphoric disorder: Secondary | ICD-10-CM

## 2022-08-15 DIAGNOSIS — F5104 Psychophysiologic insomnia: Secondary | ICD-10-CM

## 2022-08-15 MED ORDER — BUSPIRONE HCL 10 MG PO TABS: 10.0000 mg | ORAL_TABLET | Freq: Two times a day (BID) | ORAL | 12 refills | Status: DC

## 2022-08-15 MED ORDER — TRAZODONE HCL 50 MG PO TABS: 25.0000 mg | ORAL_TABLET | Freq: Every evening | ORAL | 5 refills | Status: AC | PRN

## 2022-08-15 NOTE — Progress Notes (Signed)
There were no vitals taken for this visit.   Subjective:    Patient ID: Brittney Knox, female    DOB: April 18, 1980, 42 y.o.   MRN: [id number]  HPI: Brittney Knox is a 42 y.o. female  Chief Complaint  Patient presents with   PMDD   Anxiety   Fatigue   Virtual Visit via Video Note  I connected with Brittney Knox on 08/15/22 at  3:00 PM EDT by a video enabled telemedicine application and verified that I am speaking with the correct person using two identifiers.  Location: Patient: home Provider: work   I discussed the limitations of evaluation and management by telemedicine and the availability of in person appointments. The patient expressed understanding and agreed to proceed.  I discussed the assessment and treatment plan with the patient. The patient was provided an opportunity to ask questions and all were answered. The patient agreed with the plan and demonstrated an understanding of the instructions.   The patient was advised to call back or seek an in-person evaluation if the symptoms worsen or if the condition fails to improve as anticipated.  I provided 21 minutes of non-face-to-face time during this encounter.   Marjie Skiff, NP   DEPRESSION & ANXIETY/PMDD Currently taking Prozac and Buspar for symptoms, changed to these on 02/12/22.  Tried Celexa in past with no benefit.  Lost step-dad yesterday, who she was very close to.  Was caring for him for two months.  Has tried Melatonin for sleep without benefit, worked for short period.  Feeling tired during day with this. Mood status: exacerbated Satisfied with current treatment?: yes Symptom severity: moderate  Duration of current treatment : chronic Side effects: no Medication compliance: good compliance Psychotherapy/counseling: yes in the past Previous psychiatric medications: Celexa  Depressed mood: yes Anxious mood: yes -- this is her baseline Anhedonia: no Significant weight loss or gain: no Insomnia:  yes hard to fall asleep Fatigue: yes Feelings of worthlessness or guilt: no Impaired concentration/indecisiveness: yes Suicidal ideations: no Hopelessness: no Crying spells: no    08/15/2022    3:00 PM 02/12/2022    2:58 PM 04/24/2021    4:41 PM 01/28/2021    2:11 PM 09/14/2020   11:06 AM  Depression screen PHQ 2/9  Decreased Interest 0 1 0  0  Down, Depressed, Hopeless 1 1 0  0  PHQ - 2 Score 1 2 0  0  Altered sleeping 3 3 1 2  0  Tired, decreased energy 2 2 1  0 0  Change in appetite 0 0 0 0 0  Feeling bad or failure about yourself  0 0 0 0 0  Trouble concentrating 1 2 0 2 0  Moving slowly or fidgety/restless 0 0 0 2 0  Suicidal thoughts 0 0 0 0 0  PHQ-9 Score 7 9 2   0  Difficult doing work/chores Not difficult at all Somewhat difficult Not difficult at all         08/15/2022    3:01 PM 02/12/2022    2:59 PM 04/24/2021    4:41 PM 01/28/2021    2:10 PM  GAD 7 : Generalized Anxiety Score  Nervous, Anxious, on Edge 3 1 1 3   Control/stop worrying 2 0 0 2  Worry too much - different things 2 0 0 2  Trouble relaxing 3 1 1 3   Restless 2 0 1 3  Easily annoyed or irritable 0 1 0 3  Afraid - awful might happen 0 0 0 1  Total GAD 7 Score 12 3 3 17   Anxiety Difficulty Not difficult at all Somewhat difficult Somewhat difficult Not difficult at all   Relevant past medical, surgical, family and social history reviewed and updated as indicated. Interim medical history since our last visit reviewed. Allergies and medications reviewed and updated.  Review of Systems  Constitutional:  Positive for fatigue. Negative for activity change, appetite change, diaphoresis and fever.  Respiratory:  Negative for cough, chest tightness and shortness of breath.   Cardiovascular:  Negative for chest pain, palpitations and leg swelling.  Neurological: Negative.   Psychiatric/Behavioral:  Positive for decreased concentration and sleep disturbance. Negative for self-injury and suicidal ideas. The patient is  nervous/anxious.     Per HPI unless specifically indicated above     Objective:    There were no vitals taken for this visit.  Wt Readings from Last 3 Encounters:  02/12/22 160 lb 14.4 oz (73 kg)  04/30/21 165 lb (74.8 kg)  04/24/21 163 lb 9.6 oz (74.2 kg)    Physical Exam Vitals and nursing note reviewed.  Constitutional:      General: She is awake. She is not in acute distress.    Appearance: She is well-developed. She is not ill-appearing.  HENT:     Head: Normocephalic.     Right Ear: Hearing normal.     Left Ear: Hearing normal.  Eyes:     General: Lids are normal.        Right eye: No discharge.        Left eye: No discharge.     Conjunctiva/sclera: Conjunctivae normal.  Pulmonary:     Effort: Pulmonary effort is normal. No accessory muscle usage or respiratory distress.  Musculoskeletal:     Cervical back: Normal range of motion.  Neurological:     Mental Status: She is alert and oriented to person, place, and time.  Psychiatric:        Attention and Perception: Attention normal.        Mood and Affect: Mood normal.        Behavior: Behavior normal. Behavior is cooperative.        Thought Content: Thought content normal.        Judgment: Judgment normal.    Results for orders placed or performed in visit on 02/12/22  CBC with Differential/Platelet  Result Value Ref Range   WBC 8.0 3.4 - 10.8 x10E3/uL   RBC 4.39 3.77 - 5.28 x10E6/uL   Hemoglobin 15.1 11.1 - 15.9 g/dL   Hematocrit 16.1 09.6 - 46.6 %   MCV 100 (H) 79 - 97 fL   MCH 34.4 (H) 26.6 - 33.0 pg   MCHC 34.6 31.5 - 35.7 g/dL   RDW 04.5 40.9 - 81.1 %   Platelets 155 150 - 450 x10E3/uL   Neutrophils 75 Not Estab. %   Lymphs 16 Not Estab. %   Monocytes 6 Not Estab. %   Eos 2 Not Estab. %   Basos 1 Not Estab. %   Neutrophils Absolute 6.0 1.4 - 7.0 x10E3/uL   Lymphocytes Absolute 1.2 0.7 - 3.1 x10E3/uL   Monocytes Absolute 0.5 0.1 - 0.9 x10E3/uL   EOS (ABSOLUTE) 0.1 0.0 - 0.4 x10E3/uL   Basophils  Absolute 0.1 0.0 - 0.2 x10E3/uL   Immature Granulocytes 0 Not Estab. %   Immature Grans (Abs) 0.0 0.0 - 0.1 x10E3/uL  Comprehensive metabolic panel  Result Value Ref Range   Glucose 84 70 - 99 mg/dL   BUN  15 6 - 20 mg/dL   Creatinine, Ser 5.62 0.57 - 1.00 mg/dL   eGFR 130 >86 VH/QIO/9.62   BUN/Creatinine Ratio 24 (H) 9 - 23   Sodium 137 134 - 144 mmol/L   Potassium 4.4 3.5 - 5.2 mmol/L   Chloride 97 96 - 106 mmol/L   CO2 22 20 - 29 mmol/L   Calcium 9.7 8.7 - 10.2 mg/dL   Total Protein 7.5 6.0 - 8.5 g/dL   Albumin 5.2 (H) 3.9 - 4.9 g/dL   Globulin, Total 2.3 1.5 - 4.5 g/dL   Albumin/Globulin Ratio 2.3 (H) 1.2 - 2.2   Bilirubin Total 1.0 0.0 - 1.2 mg/dL   Alkaline Phosphatase 65 44 - 121 IU/L   AST 18 0 - 40 IU/L   ALT 12 0 - 32 IU/L  Lipid Panel w/o Chol/HDL Ratio  Result Value Ref Range   Cholesterol, Total 227 (H) 100 - 199 mg/dL   Triglycerides 952 0 - 149 mg/dL   HDL 61 >84 mg/dL   VLDL Cholesterol Cal 18 5 - 40 mg/dL   LDL Chol Calc (NIH) 132 (H) 0 - 99 mg/dL  TSH  Result Value Ref Range   TSH 1.320 0.450 - 4.500 uIU/mL  VITAMIN D 25 Hydroxy (Vit-D Deficiency, Fractures)  Result Value Ref Range   Vit D, 25-Hydroxy 23.1 (L) 30.0 - 100.0 ng/mL  Iron Binding Cap (TIBC)([facility])  Result Value Ref Range   Total Iron Binding Capacity 362 250 - 450 ug/dL   UIBC 440 102 - 725 ug/dL   Iron 70 27 - 366 ug/dL   Iron Saturation 19 15 - 55 %  Ferritin  Result Value Ref Range   Ferritin 166 (H) 15 - 150 ng/mL      Assessment & Plan:   Problem List Items Addressed This Visit       Other   Anxiety and depression - Primary    Chronic, ongoing.  Did not benefit from Celexa in past.  Denies SI/HI.  Continue Prozac at 20 MG daily and increase Buspar to 10 MG BID for anxiety.  Recent loss of step dad, which has exacerbated mood some.  Could consider increase of Prozac in future if needed.  Has significant family history of anxiety.  ?need for ADD/ADHD testing in  future.      Relevant Medications   busPIRone (BUSPAR) 10 MG tablet   traZODone (DESYREL) 50 MG tablet   Insomnia    Ongoing, no benefit from Melatonin.  Discussed with patient and will trial Trazodone 25-50 MG at night PRN.  Educated her on this medication and use + other options available for sleep aides.  She is agreeable to this plan.  Medication sent in.      PMDD (premenstrual dysphoric disorder)    Refer to anxiety and depression plan of care.      Relevant Medications   busPIRone (BUSPAR) 10 MG tablet   traZODone (DESYREL) 50 MG tablet     Follow up plan: Return in about 6 weeks (around 09/26/2022) for MOOD and SLEEP.

## 2022-08-15 NOTE — Patient Instructions (Signed)
Insomnia Insomnia is a sleep disorder that makes it difficult to fall asleep or stay asleep. Insomnia can cause fatigue, low energy, difficulty concentrating, mood swings, and poor performance at work or school. There are three different ways to classify insomnia: Difficulty falling asleep. Difficulty staying asleep. Waking up too early in the morning. Any type of insomnia can be long-term (chronic) or short-term (acute). Both are common. Short-term insomnia usually lasts for 3 months or less. Chronic insomnia occurs at least three times a week for longer than 3 months. What are the causes? Insomnia may be caused by another condition, situation, or substance, such as: Having certain mental health conditions, such as anxiety and depression. Using caffeine, alcohol, tobacco, or drugs. Having gastrointestinal conditions, such as gastroesophageal reflux disease (GERD). Having certain medical conditions. These include: Asthma. Alzheimer's disease. Stroke. Chronic pain. An overactive thyroid gland (hyperthyroidism). Other sleep disorders, such as restless legs syndrome and sleep apnea. Menopause. Sometimes, the cause of insomnia may not be known. What increases the risk? Risk factors for insomnia include: Gender. Females are affected more often than males. Age. Insomnia is more common as people get older. Stress and certain medical and mental health conditions. Lack of exercise. Having an irregular work schedule. This may include working night shifts and traveling between different time zones. What are the signs or symptoms? If you have insomnia, the main symptom is having trouble falling asleep or having trouble staying asleep. This may lead to other symptoms, such as: Feeling tired or having low energy. Feeling nervous about going to sleep. Not feeling rested in the morning. Having trouble concentrating. Feeling irritable, anxious, or depressed. How is this diagnosed? This condition  may be diagnosed based on: Your symptoms and medical history. Your health care provider may ask about: Your sleep habits. Any medical conditions you have. Your mental health. A physical exam. How is this treated? Treatment for insomnia depends on the cause. Treatment may focus on treating an underlying condition that is causing the insomnia. Treatment may also include: Medicines to help you sleep. Counseling or therapy. Lifestyle adjustments to help you sleep better. Follow these instructions at home: Eating and drinking  Limit or avoid alcohol, caffeinated beverages, and products that contain nicotine and tobacco, especially close to bedtime. These can disrupt your sleep. Do not eat a large meal or eat spicy foods right before bedtime. This can lead to digestive discomfort that can make it hard for you to sleep. Sleep habits  Keep a sleep diary to help you and your health care provider figure out what could be causing your insomnia. Write down: When you sleep. When you wake up during the night. How well you sleep and how rested you feel the next day. Any side effects of medicines you are taking. What you eat and drink. Make your bedroom a dark, comfortable place where it is easy to fall asleep. Put up shades or blackout curtains to block light from outside. Use a white noise machine to block noise. Keep the temperature cool. Limit screen use before bedtime. This includes: Not watching TV. Not using your smartphone, tablet, or computer. Stick to a routine that includes going to bed and waking up at the same times every day and night. This can help you fall asleep faster. Consider making a quiet activity, such as reading, part of your nighttime routine. Try to avoid taking naps during the day so that you sleep better at night. Get out of bed if you are still awake after   15 minutes of trying to sleep. Keep the lights down, but try reading or doing a quiet activity. When you feel  sleepy, go back to bed. General instructions Take over-the-counter and prescription medicines only as told by your health care provider. Exercise regularly as told by your health care provider. However, avoid exercising in the hours right before bedtime. Use relaxation techniques to manage stress. Ask your health care provider to suggest some techniques that may work well for you. These may include: Breathing exercises. Routines to release muscle tension. Visualizing peaceful scenes. Make sure that you drive carefully. Do not drive if you feel very sleepy. Keep all follow-up visits. This is important. Contact a health care provider if: You are tired throughout the day. You have trouble in your daily routine due to sleepiness. You continue to have sleep problems, or your sleep problems get worse. Get help right away if: You have thoughts about hurting yourself or someone else. Get help right away if you feel like you may hurt yourself or others, or have thoughts about taking your own life. Go to your nearest emergency room or: Call 911. Call the National Suicide Prevention Lifeline at 1-800-273-8255 or 988. This is open 24 hours a day. Text the Crisis Text Line at 741741. Summary Insomnia is a sleep disorder that makes it difficult to fall asleep or stay asleep. Insomnia can be long-term (chronic) or short-term (acute). Treatment for insomnia depends on the cause. Treatment may focus on treating an underlying condition that is causing the insomnia. Keep a sleep diary to help you and your health care provider figure out what could be causing your insomnia. This information is not intended to replace advice given to you by your health care provider. Make sure you discuss any questions you have with your health care provider. Document Revised: 12/03/2020 Document Reviewed: 12/03/2020 Elsevier Patient Education  2024 [employer] Inc.  

## 2022-08-15 NOTE — Assessment & Plan Note (Signed)
Refer to anxiety and depression plan of care. 

## 2022-08-15 NOTE — Assessment & Plan Note (Signed)
Ongoing, no benefit from Melatonin.  Discussed with patient and will trial Trazodone 25-50 MG at night PRN.  Educated her on this medication and use + other options available for sleep aides.  She is agreeable to this plan.  Medication sent in.

## 2022-08-15 NOTE — Assessment & Plan Note (Signed)
Chronic, ongoing.  Did not benefit from Celexa in past.  Denies SI/HI.  Continue Prozac at 20 MG daily and increase Buspar to 10 MG BID for anxiety.  Recent loss of step dad, which has exacerbated mood some.  Could consider increase of Prozac in future if needed.  Has significant family history of anxiety.  ?need for ADD/ADHD testing in future.

## 2022-08-18 NOTE — Progress Notes (Signed)
Attempted to reach patient, LVM to call office back to get scheduled for 6 week follow up.  Put in CRM.

## 2022-09-22 ENCOUNTER — Ambulatory Visit: Payer: Self-pay | Admitting: *Deleted

## 2022-09-22 NOTE — Telephone Encounter (Signed)
Pt returned call. Message fro [place] reviewed. Pt verbalizes understanding.

## 2022-09-22 NOTE — Telephone Encounter (Signed)
Called and LVM asking for patient to please return my call.   OK for PEC to give patient Jolene's message if she calls back.

## 2022-09-22 NOTE — Telephone Encounter (Signed)
Message from [place] given to the patient.

## 2022-09-22 NOTE — Telephone Encounter (Signed)
  Chief Complaint: dizziness , anxiety from taking medications. Wants to know if possible reaction to trazodone , buspar and prozac Symptoms: dizziness this am , anxious due to dizziness. Patient going home from work early  Frequency: this am  Pertinent Negatives: Patient denies chest pain no difficulty breathing  Disposition: [] ED /[] Urgent Care (no appt availability in office) / [] Appointment(In office/virtual)/ []  [facility]/ [] Home Care/ [] Refused Recommended Disposition /[] [facility]/ [x]  Follow-up with PCP Additional Notes:   Recommended to get home safely and increase water intake and rest. If dizziness worsens call back. Recommended to contact pharmacist regarding possible S/E of medications taken together. Taking trazodone 50 mg approx 9:30 pm . Takes buspar around 7-8 pm. Please advise . Patient would like a call back from PCP. Please advise if appt needed.      Reason for Disposition  Taking a medicine that could cause dizziness (e.g., blood pressure medications, diuretics)  Answer Assessment - Initial Assessment Questions 1. DESCRIPTION: "Describe your dizziness."     Dizzy after taking trazadone 50 mg and prozac and buspar 2. LIGHTHEADED: "Do you feel lightheaded?" (e.g., somewhat faint, woozy, weak upon standing)     Dizzy  3. VERTIGO: "Do you feel like either you or the room is spinning or tilting?" (i.e. vertigo)     na 4. SEVERITY: "How bad is it?"  "Do you feel like you are going to faint?" "Can you stand and walk?"   - MILD: Feels slightly dizzy, but walking normally.   - MODERATE: Feels unsteady when walking, but not falling; interferes with normal activities (e.g., school, work).   - SEVERE: Unable to walk without falling, or requires assistance to walk without falling; feels like passing out now.      Can function but did leave work early dizziness  causing anxiety  5. ONSET:  "When did the dizziness begin?"     This am  6. AGGRAVATING  FACTORS: "Does anything make it worse?" (e.g., standing, change in head position)     Na  7. HEART RATE: "Can you tell me your heart rate?" "How many beats in 15 seconds?"  (Note: not all patients can do this)       na 8. CAUSE: "What do you think is causing the dizziness?"     Medication  9. RECURRENT SYMPTOM: "Have you had dizziness before?" If Yes, ask: "When was the last time?" "What happened that time?"     na 10. OTHER SYMPTOMS: "Do you have any other symptoms?" (e.g., fever, chest pain, vomiting, diarrhea, bleeding)       Dizzy anxious  11. PREGNANCY: "Is there any chance you are pregnant?" "When was your last menstrual period?"       na  Protocols used: Dizziness - Lightheadedness-A-AH

## 2022-09-24 DIAGNOSIS — R0609 Other forms of dyspnea: Secondary | ICD-10-CM | POA: Diagnosis not present

## 2022-09-24 DIAGNOSIS — R002 Palpitations: Secondary | ICD-10-CM | POA: Diagnosis not present

## 2022-09-24 DIAGNOSIS — I341 Nonrheumatic mitral (valve) prolapse: Secondary | ICD-10-CM | POA: Diagnosis not present

## 2022-09-24 DIAGNOSIS — R600 Localized edema: Secondary | ICD-10-CM | POA: Diagnosis not present

## 2022-10-11 DIAGNOSIS — I341 Nonrheumatic mitral (valve) prolapse: Secondary | ICD-10-CM | POA: Insufficient documentation

## 2022-10-13 ENCOUNTER — Ambulatory Visit: Payer: BC Managed Care – PPO | Admitting: Nurse Practitioner

## 2022-10-13 DIAGNOSIS — F3281 Premenstrual dysphoric disorder: Secondary | ICD-10-CM

## 2022-10-13 DIAGNOSIS — Z862 Personal history of diseases of the blood and blood-forming organs and certain disorders involving the immune mechanism: Secondary | ICD-10-CM

## 2022-10-13 DIAGNOSIS — F419 Anxiety disorder, unspecified: Secondary | ICD-10-CM

## 2022-10-13 DIAGNOSIS — I341 Nonrheumatic mitral (valve) prolapse: Secondary | ICD-10-CM

## 2022-10-13 DIAGNOSIS — R0602 Shortness of breath: Secondary | ICD-10-CM

## 2022-10-13 DIAGNOSIS — D696 Thrombocytopenia, unspecified: Secondary | ICD-10-CM

## 2022-10-28 DIAGNOSIS — R0609 Other forms of dyspnea: Secondary | ICD-10-CM | POA: Diagnosis not present

## 2022-10-28 DIAGNOSIS — R002 Palpitations: Secondary | ICD-10-CM | POA: Diagnosis not present

## 2022-10-28 DIAGNOSIS — Z8679 Personal history of other diseases of the circulatory system: Secondary | ICD-10-CM | POA: Diagnosis not present

## 2022-12-01 ENCOUNTER — Other Ambulatory Visit: Payer: Self-pay | Admitting: Nurse Practitioner

## 2022-12-01 DIAGNOSIS — Z1231 Encounter for screening mammogram for malignant neoplasm of breast: Secondary | ICD-10-CM

## 2022-12-18 ENCOUNTER — Ambulatory Visit (INDEPENDENT_AMBULATORY_CARE_PROVIDER_SITE_OTHER): Payer: BC Managed Care – PPO | Admitting: Pediatrics

## 2022-12-18 ENCOUNTER — Encounter: Payer: Self-pay | Admitting: Pediatrics

## 2022-12-18 VITALS — BP 95/66 | HR 86 | Temp 97.6°F | Ht 64.0 in | Wt 163.4 lb

## 2022-12-18 DIAGNOSIS — F419 Anxiety disorder, unspecified: Secondary | ICD-10-CM

## 2022-12-18 DIAGNOSIS — Z1330 Encounter for screening examination for mental health and behavioral disorders, unspecified: Secondary | ICD-10-CM

## 2022-12-18 DIAGNOSIS — R52 Pain, unspecified: Secondary | ICD-10-CM

## 2022-12-18 DIAGNOSIS — J189 Pneumonia, unspecified organism: Secondary | ICD-10-CM

## 2022-12-18 DIAGNOSIS — F3281 Premenstrual dysphoric disorder: Secondary | ICD-10-CM | POA: Diagnosis not present

## 2022-12-18 DIAGNOSIS — F32A Depression, unspecified: Secondary | ICD-10-CM

## 2022-12-18 MED ORDER — AZITHROMYCIN 250 MG PO TABS: ORAL_TABLET | ORAL | 0 refills | Status: DC

## 2022-12-18 MED ORDER — ALBUTEROL SULFATE HFA 108 (90 BASE) MCG/ACT IN AERS: 2.0000 | INHALATION_SPRAY | Freq: Four times a day (QID) | RESPIRATORY_TRACT | 0 refills | Status: AC | PRN

## 2022-12-18 NOTE — Assessment & Plan Note (Signed)
Ongoing anxiety and panic attacks, particularly around menstrual cycle. Currently on Prozac and Buspar, but symptoms persist. -Consider increasing Prozac dosage at her follow up visit, declines OCP therapy -Refer to [email or web address]://[email or web address]/ for additional resources and treatment options. -Schedule follow-up appointment in one month to reassess symptoms and treatment plan.

## 2022-12-18 NOTE — Patient Instructions (Addendum)
PMDD: [email or web address]  Most cold symptoms last up to 2 weeks, but cough can sometimes linger up to 4 weeks.  However if your symtpoms get WORSE - like you develop fevers or get more shortness of breath, then call your clinic as you may need to be evaluated.   Aches and Pains Acetaminophen (Tylenol): 1000mg  ("extra strength" tablets are 500mg , so take 2) every 8 hours if needed  Ibuprofen (Advil/Motrin) 400-800mg  (comes in 200mg  pills OTC, so 2-4 pills) every 8 hours.   Sore Throat:  See Aches and Pains meds above, also Sore throat sprays and lozenges may also help.   Cough:  Honey 2 TBS every 4-6 hours if needed.  Robitussin DM syrup or generic equivalent which has (guaifenesin = an expectorant to help you get stuff up + dextromethorphan (DM) = cough supressant). You can also get this in tablet formula (like Mucinex DM or generic equivalent).  If you have asthma or are wheezing and have a tight chest, then albuterol inhaler (Ventolin, ProAir) may be helpful - you need a prescription for this.   Congestion:  oxymetazoline (Afrin) nasal stray: 2 sprays each nostril every 12 hours. Don't use more than 3 days in a row to avoid building a tolerance to it.  Sinus rinse (neti pot) high volume sinus rinse can help open up your sinuses and be helpful, especially if you're having sinus pressure and headaches.   Other:  Umcka (pelargonium sidoides extract) can to shorten cold symptoms (can be hard to find, but [employer] carries it: brand name Umcka ColdCare from [employer]). Works best if you start taking at earliest signs of cold symptoms.  Andrographis paniculata is another herbal remedy with less evidence, but may reduce common cold symptoms in adults.  zinc acetate lozenges >= 80 mg/day reduces duration but not severity of cold symptoms in adults, but it is associated with bad taste and nausea Heated humidified air may reduce cold symptoms, so try using a humidifier - especially in your  bedroom at night.  Stay hydrated! Aim to drink at least 2 liters of water daily.   What doesn't work (but lots of folks think might) Vitamin C: bummer right?! But there's no evidence that high dose vitamin C will help cold symptoms.

## 2022-12-18 NOTE — Progress Notes (Signed)
Office Visit  BP 95/66 (BP Location: Left Arm, Patient Position: Sitting, Cuff Size: Normal)   Pulse 86   Temp 97.6 F (36.4 C) (Oral)   Ht 5\' 4"  (1.626 m)   Wt 163 lb 6.4 oz (74.1 kg)   SpO2 99%   BMI 28.05 kg/m    Subjective:    Patient ID: Brittney Knox, female    DOB: 1980/12/02, 42 y.o.   MRN: [id number]  HPI: Brittney Knox is a 42 y.o. female  Chief Complaint  Patient presents with   Generalized Body Aches   cold   light headed   Nausea    Has taken mucinex for symptoms and albuterol as needed    Medication Refill    Albuterol   Cough    Discussed the use of AI scribe software for clinical note transcription with the patient, who gave verbal consent to proceed.  History of Present Illness   The patient, with a history of anxiety and PMDD, presented with symptoms that began on Thanksgiving Day with nasal congestion. She initially managed the congestion with Mucinex, which seemed to alleviate the symptom. However, four to five days prior to the consultation, she developed a dry cough that progressed to productive cough with thick green phlegm. The cough was associated with generalized body aches and nausea, which the patient attributed to the intensity of the cough. Despite the use of Mucinex, the symptoms persisted and did not seem to improve.  The patient also reported frequent use of an albuterol inhaler, although she has not been formally diagnosed with asthma. She expressed interest in getting tested for asthma in the future.  In addition to the respiratory symptoms, the patient discussed her ongoing struggle with anxiety and PMDD. She is currently on Buspar and Prozac, but she reported that these medications do not seem to significantly alleviate her symptoms, particularly the panic attacks that occur around her menstrual cycle. She also mentioned a history of endometrial ablation due to heavy bleeding, and a strong preference against hormonal treatments.       Relevant past medical, surgical, family and social history reviewed and updated as indicated. Interim medical history since our last visit reviewed. Allergies and medications reviewed and updated.  ROS per HPI unless specifically indicated above     Objective:    BP 95/66 (BP Location: Left Arm, Patient Position: Sitting, Cuff Size: Normal)   Pulse 86   Temp 97.6 F (36.4 C) (Oral)   Ht 5\' 4"  (1.626 m)   Wt 163 lb 6.4 oz (74.1 kg)   SpO2 99%   BMI 28.05 kg/m   Wt Readings from Last 3 Encounters:  12/18/22 163 lb 6.4 oz (74.1 kg)  02/12/22 160 lb 14.4 oz (73 kg)  04/30/21 165 lb (74.8 kg)     Physical Exam Constitutional:      Appearance: Normal appearance.  HENT:     Head: Normocephalic and atraumatic.  Eyes:     Pupils: Pupils are equal, round, and reactive to light.  Cardiovascular:     Rate and Rhythm: Normal rate and regular rhythm.     Pulses: Normal pulses.     Heart sounds: Normal heart sounds.  Pulmonary:     Effort: Pulmonary effort is normal.     Breath sounds: Normal breath sounds.     Comments: Faint wheezing with inspiration, cleared with deep breathing Abdominal:     General: Abdomen is flat.     Palpations: Abdomen is soft.  Musculoskeletal:  General: Normal range of motion.     Cervical back: Normal range of motion.  Skin:    General: Skin is warm and dry.     Capillary Refill: Capillary refill takes less than 2 seconds.  Neurological:     General: No focal deficit present.     Mental Status: She is alert. Mental status is at baseline.  Psychiatric:        Mood and Affect: Mood normal.        Behavior: Behavior normal.        12/18/2022    4:42 PM 08/15/2022    3:00 PM 02/12/2022    2:58 PM 04/24/2021    4:41 PM 01/28/2021    2:11 PM  Depression screen PHQ 2/9  Decreased Interest 0 0 1 0   Down, Depressed, Hopeless 0 1 1 0   PHQ - 2 Score 0 1 2 0   Altered sleeping 2 3 3 1 2   Tired, decreased energy 2 2 2 1  0  Change in appetite  0 0 0 0 0  Feeling bad or failure about yourself  0 0 0 0 0  Trouble concentrating 2 1 2  0 2  Moving slowly or fidgety/restless 0 0 0 0 2  Suicidal thoughts 0 0 0 0 0  PHQ-9 Score 6 7 9 2    Difficult doing work/chores Not difficult at all Not difficult at all Somewhat difficult Not difficult at all        12/18/2022    4:43 PM 08/15/2022    3:01 PM 02/12/2022    2:59 PM 04/24/2021    4:41 PM  GAD 7 : Generalized Anxiety Score  Nervous, Anxious, on Edge 2 3 1 1   Control/stop worrying 1 2 0 0  Worry too much - different things 0 2 0 0  Trouble relaxing 2 3 1 1   Restless 2 2 0 1  Easily annoyed or irritable 2 0 1 0  Afraid - awful might happen 0 0 0 0  Total GAD 7 Score 9 12 3 3   Anxiety Difficulty Somewhat difficult Not difficult at all Somewhat difficult Somewhat difficult       Assessment & Plan:  Assessment & Plan   Atypical pneumonia Body aches Persistent cough with green phlegm, body aches, and nausea for approximately 4-5 days. Requiring increase albuterol use. No fever reported. Possible post-viral atypical pneumonia given recurrence and worsened symptoms. -Continue Mucinex and add Mucinex DM to regimen. -Refill Albuterol inhaler and use as needed. -Prescribe Z-Pak (Azithromycin) and monitor for improvement. -If no improvement in one week, consider stronger antibiotics or imaging. -Test for flu and COVID-19. -Consider formal asthma testing when patient is feeling better. Assessment and Plan -     Azithromycin; Take 2 tablets on day 1, then 1 tablet daily on days 2 through 5  Dispense: 6 tablet; Refill: 0 -     Albuterol Sulfate HFA; Inhale 2 puffs into the lungs every 6 (six) hours as needed for wheezing or shortness of breath.  Dispense: 8 g; Refill: 0 -     Novel Coronavirus, NAA ([facility]) -     Veritor Flu A/B Waived  PMDD (premenstrual dysphoric disorder) Anxiety and depression Assessment & Plan: Ongoing anxiety and panic attacks, particularly around menstrual  cycle. Currently on Prozac and Buspar, but symptoms persist. No acute safety concerns at this time. -Consider increasing Prozac dosage at her follow up visit, declines OCP therapy -Refer to [email or web address]://[facility]ens.org/ for additional resources and treatment options. -  Schedule follow-up appointment in one month to reassess symptoms and treatment plan.   Encounter for behavioral health screening As part of their intake evaluation, the patient was screened for depression, anxiety.  PHQ9 SCORE 6, GAD7 SCORE 9. Screening results positive for tested conditions. See plan under problem/diagnosis above.  Follow up plan: Return in about 4 weeks (around 01/15/2023), or if symptoms worsen or fail to improve, for Mood.  Brihana Quickel Howell Pringle, MD  Approximately total 30 minutes spent on patient encounter today including assessment, counseling, diagnosing, treatment plan development, and charting.

## 2022-12-19 LAB — VERITOR FLU A/B WAIVED
Influenza A: NEGATIVE
Influenza B: NEGATIVE

## 2022-12-20 LAB — NOVEL CORONAVIRUS, NAA: SARS-CoV-2, NAA: NOT DETECTED

## 2022-12-22 ENCOUNTER — Ambulatory Visit (INDEPENDENT_AMBULATORY_CARE_PROVIDER_SITE_OTHER): Payer: BC Managed Care – PPO | Admitting: Pediatrics

## 2022-12-22 ENCOUNTER — Encounter: Payer: Self-pay | Admitting: Pediatrics

## 2022-12-22 ENCOUNTER — Ambulatory Visit
Admission: RE | Admit: 2022-12-22 | Discharge: 2022-12-22 | Disposition: A | Discharge status: Home / Self Care | Payer: BC Managed Care – PPO | Source: Ambulatory Visit | Attending: Pediatrics | Admitting: Pediatrics

## 2022-12-22 ENCOUNTER — Ambulatory Visit: Payer: Self-pay | Admitting: *Deleted

## 2022-12-22 ENCOUNTER — Ambulatory Visit
Admission: RE | Admit: 2022-12-22 | Discharge: 2022-12-22 | Disposition: A | Discharge status: Home / Self Care | Payer: BC Managed Care – PPO | Source: Home / Self Care | Attending: Pediatrics | Admitting: Pediatrics

## 2022-12-22 VITALS — BP 111/75 | HR 94 | Temp 97.8°F | Resp 14 | Wt 164.8 lb

## 2022-12-22 DIAGNOSIS — Z1330 Encounter for screening examination for mental health and behavioral disorders, unspecified: Secondary | ICD-10-CM

## 2022-12-22 DIAGNOSIS — R059 Cough, unspecified: Secondary | ICD-10-CM | POA: Diagnosis not present

## 2022-12-22 DIAGNOSIS — J069 Acute upper respiratory infection, unspecified: Secondary | ICD-10-CM | POA: Insufficient documentation

## 2022-12-22 DIAGNOSIS — M791 Myalgia, unspecified site: Secondary | ICD-10-CM

## 2022-12-22 MED ORDER — AMOXICILLIN 500 MG PO CAPS: 1000.0000 mg | ORAL_CAPSULE | Freq: Three times a day (TID) | ORAL | 0 refills | Status: AC

## 2022-12-22 NOTE — Telephone Encounter (Signed)
Summary: Antibotics is not working   Patient completed antibiotics today and she's still experiencing congestion, body aches and coughing. Patient was seen 12/18/2022 by Dr. Evelene Croon and was advised if she does not feel body to give her call.         Attempted to call patient- no answer- left message to call office.

## 2022-12-22 NOTE — Telephone Encounter (Signed)
    Chief Complaint: Productive cough, wheezing after completing antibiotic."I don't feel any better." Symptoms: Above Frequency: Thanksgiving Pertinent Negatives: Patient denies fever Disposition: [] ED /[] Urgent Care (no appt availability in office) / [x] Appointment(In office/virtual)/ []  [facility]/ [] Home Care/ [] Refused Recommended Disposition /[] [facility]/ []  Follow-up with PCP Additional Notes: Agrees with appointment.  Reason for Disposition  [1] MILD difficulty breathing (e.g., minimal/no SOB at rest, SOB with walking, pulse <100) AND [2] still present when not coughing  Answer Assessment - Initial Assessment Questions 1. ONSET: "When did the cough begin?"      Thanksgiving 2. SEVERITY: "How bad is the cough today?"      Severe 3. SPUTUM: "Describe the color of your sputum" (none, dry cough; clear, white, yellow, green)     Thick brown 4. HEMOPTYSIS: "Are you coughing up any blood?" If so ask: "How much?" (flecks, streaks, tablespoons, etc.)     No 5. DIFFICULTY BREATHING: "Are you having difficulty breathing?" If Yes, ask: "How bad is it?" (e.g., mild, moderate, severe)    - MILD: No SOB at rest, mild SOB with walking, speaks normally in sentences, can lie down, no retractions, pulse < 100.    - MODERATE: SOB at rest, SOB with minimal exertion and prefers to sit, cannot lie down flat, speaks in phrases, mild retractions, audible wheezing, pulse 100-120.    - SEVERE: Very SOB at rest, speaks in single words, struggling to breathe, sitting hunched forward, retractions, pulse > 120      Mild 6. FEVER: "Do you have a fever?" If Yes, ask: "What is your temperature, how was it measured, and when did it start?"     No 7. CARDIAC HISTORY: "Do you have any history of heart disease?" (e.g., heart attack, congestive heart failure)      No 8. LUNG HISTORY: "Do you have any history of lung disease?"  (e.g., pulmonary embolus, asthma, emphysema)     Asthma 9. PE  RISK FACTORS: "Do you have a history of blood clots?" (or: recent major surgery, recent prolonged travel, bedridden)     No 10. OTHER SYMPTOMS: "Do you have any other symptoms?" (e.g., runny nose, wheezing, chest pain)       Body aches, wheezing 11. PREGNANCY: "Is there any chance you are pregnant?" "When was your last menstrual period?"       No 12. TRAVEL: "Have you traveled out of the country in the last month?" (e.g., travel history, exposures)       No  Protocols used: Cough - Acute Productive-A-AH

## 2022-12-22 NOTE — Progress Notes (Signed)
Office Visit  BP 111/75 (BP Location: Left Arm, Patient Position: Sitting, Cuff Size: Normal)   Pulse 94   Temp 97.8 F (36.6 C) (Oral)   Resp 14   Wt 164 lb 12.8 oz (74.8 kg)   LMP 12/16/2022   SpO2 99%   BMI 28.29 kg/m    Subjective:    Patient ID: Brittney Knox, female    DOB: 22-Nov-1980, 42 y.o.   MRN: [id number]  HPI: Brittney Knox is a 42 y.o. female  Chief Complaint  Patient presents with   Cough    Z-pack is finished, still coughing, tired, achy and headaches    Discussed the use of AI scribe software for clinical note transcription with the patient, who gave verbal consent to proceed.  History of Present Illness   The patient presents with persistent symptoms of fatigue, cough, and headaches. She reports that the albuterol inhaler has provided minimal relief, aiding slightly in expectoration. The patient's symptoms have not improved significantly despite previous treatment with azithromycin. She also reports a feeling of tightness in the chest, but denies any increase in shortness of breath. The patient has been managing her symptoms with over-the-counter Mucinex DM. She denies any urinary symptoms, nausea, vomiting, recent travel, leg swelling, or pain on deep inspiration. The patient's symptoms have remained consistent, with a brief period of feeling less achy, followed by a return of the severe fatigue.      Relevant past medical, surgical, family and social history reviewed and updated as indicated. Interim medical history since our last visit reviewed. Allergies and medications reviewed and updated.  ROS per HPI unless specifically indicated above     Objective:    BP 111/75 (BP Location: Left Arm, Patient Position: Sitting, Cuff Size: Normal)   Pulse 94   Temp 97.8 F (36.6 C) (Oral)   Resp 14   Wt 164 lb 12.8 oz (74.8 kg)   LMP 12/16/2022   SpO2 99%   BMI 28.29 kg/m   Wt Readings from Last 3 Encounters:  12/22/22 164 lb 12.8 oz (74.8 kg)   12/18/22 163 lb 6.4 oz (74.1 kg)  02/12/22 160 lb 14.4 oz (73 kg)     Physical Exam Constitutional:      Appearance: Normal appearance.  HENT:     Head: Normocephalic and atraumatic.  Eyes:     Pupils: Pupils are equal, round, and reactive to light.  Cardiovascular:     Rate and Rhythm: Normal rate and regular rhythm.     Pulses: Normal pulses.     Heart sounds: Normal heart sounds.  Pulmonary:     Effort: Pulmonary effort is normal.     Breath sounds: Normal breath sounds.     Comments: Decreased air movement on the right lung fields improved with deep inspiration though largely normal pulmonary exam Abdominal:     General: Abdomen is flat.     Palpations: Abdomen is soft.  Musculoskeletal:        General: Normal range of motion.     Cervical back: Normal range of motion.  Skin:    General: Skin is warm and dry.     Capillary Refill: Capillary refill takes less than 2 seconds.  Neurological:     General: No focal deficit present.     Mental Status: She is alert. Mental status is at baseline.  Psychiatric:        Mood and Affect: Mood normal.        Behavior: Behavior normal.  12/22/2022    2:33 PM 12/18/2022    4:42 PM 08/15/2022    3:00 PM 02/12/2022    2:58 PM 04/24/2021    4:41 PM  Depression screen PHQ 2/9  Decreased Interest 0 0 0 1 0  Down, Depressed, Hopeless 0 0 1 1 0  PHQ - 2 Score 0 0 1 2 0  Altered sleeping 2 2 3 3 1   Tired, decreased energy 2 2 2 2 1   Change in appetite 0 0 0 0 0  Feeling bad or failure about yourself  0 0 0 0 0  Trouble concentrating 2 2 1 2  0  Moving slowly or fidgety/restless 0 0 0 0 0  Suicidal thoughts 0 0 0 0 0  PHQ-9 Score 6 6 7 9 2   Difficult doing work/chores Not difficult at all Not difficult at all Not difficult at all Somewhat difficult Not difficult at all       12/22/2022    2:34 PM 12/18/2022    4:43 PM 08/15/2022    3:01 PM 02/12/2022    2:59 PM  GAD 7 : Generalized Anxiety Score  Nervous, Anxious, on Edge  2 2 3 1   Control/stop worrying 0 1 2 0  Worry too much - different things 0 0 2 0  Trouble relaxing 2 2 3 1   Restless 2 2 2  0  Easily annoyed or irritable 1 2 0 1  Afraid - awful might happen 0 0 0 0  Total GAD 7 Score 7 9 12 3   Anxiety Difficulty Somewhat difficult Somewhat difficult Not difficult at all Somewhat difficult       Assessment & Plan:  Assessment & Plan   Upper respiratory tract infection, unspecified type Myalgia Persistent symptoms despite azithromycin treatment. About 2 weeks of symptoms at this point. No improvement with albuterol inhaler. No additional symptoms such as urinary symptoms, nausea, vomiting. No recent travel or leg swelling. Persistent shortness of breath, but reports feeling tightness. Exam largely reassuring but ongoing chest pressure, cough and myalgias/fatigue at home c/f CAP requiring broader coverage. Will treat w amoxicillin. XR as below given cardiac hx. Return if no improvement. -Order chest x-ray to look for PNA and rule out  fluid accumulation due to cardiac history. -Start Amoxicillin, 2 tabs three times daily for 3 days. -Order CBC to check for elevated white count. -Consider reswabbing if no improvement. -     DG Chest 2 View; Future -     Amoxicillin; Take 2 capsules (1,000 mg total) by mouth 3 (three) times daily for 3 days.  Dispense: 18 capsule; Refill: 0 -     CBC -Currently using Mucinex DM. -Continue current regimen.      Encounter for behavioral health screening As part of their intake evaluation, the patient was screened for depression, anxiety.  PHQ9 SCORE 6, GAD7 SCORE 7. Screening results negative for tested conditions. On treatment w buspar, prozac and trazadone. Continue to monitor.  Follow up plan: Return if symptoms worsen or fail to improve.  Taeya Theall Howell Pringle, MD  Approximately 30 minutes spent on patient encounter today including assessment, counseling, diagnosing, treatment plan development, and charting.

## 2022-12-22 NOTE — Patient Instructions (Signed)
[facility] [address] [place],  [place]  [address]

## 2022-12-23 LAB — CBC
Hematocrit: 39.5 % (ref 34.0–46.6)
Hemoglobin: 13.4 g/dL (ref 11.1–15.9)
MCH: 34.2 pg — ABNORMAL HIGH (ref 26.6–33.0)
MCHC: 33.9 g/dL (ref 31.5–35.7)
MCV: 101 fL — ABNORMAL HIGH (ref 79–97)
Platelets: 203 10*3/uL (ref 150–450)
RBC: 3.92 x10E6/uL (ref 3.77–5.28)
RDW: 11.8 % (ref 11.7–15.4)
WBC: 6.1 10*3/uL (ref 3.4–10.8)

## 2022-12-24 ENCOUNTER — Ambulatory Visit
Admission: RE | Admit: 2022-12-24 | Discharge: 2022-12-24 | Disposition: A | Discharge status: Home / Self Care | Payer: BC Managed Care – PPO | Source: Ambulatory Visit | Attending: Nurse Practitioner | Admitting: Nurse Practitioner

## 2022-12-24 DIAGNOSIS — Z1231 Encounter for screening mammogram for malignant neoplasm of breast: Secondary | ICD-10-CM | POA: Diagnosis not present

## 2022-12-26 NOTE — Progress Notes (Signed)
Contacted via MyChart   Normal mammogram, may repeat in one year:)

## 2023-01-09 NOTE — Patient Instructions (Addendum)
 Airsupra = Two inhalations as needed; maximum daily dose 12 inhalations/day.  Managing Anxiety, Adult After being diagnosed with anxiety, you may be relieved to know why you have felt or behaved a certain way. You may also feel overwhelmed about the treatment ahead and what it will mean for your life. With care and support, you can manage your anxiety. How to manage lifestyle changes Understanding the difference between stress and anxiety Although stress can play a role in anxiety, it is not the same as anxiety. Stress is your body's reaction to life changes and events, both good and bad. Stress is often caused by something external, such as a deadline, test, or competition. It normally goes away after the event has ended and will last just a few hours. But, stress can be ongoing and can lead to more than just stress. Anxiety is caused by something internal, such as imagining a terrible outcome or worrying that something will go wrong that will greatly upset you. Anxiety often does not go away even after the event is over, and it can become a long-term (chronic) worry. Lowering stress and anxiety Talk with your health care provider or a counselor to learn more about lowering anxiety and stress. They may suggest tension-reduction techniques, such as: Music. Spend time creating or listening to music that you enjoy and that inspires you. Mindfulness-based meditation. Practice being aware of your normal breaths while not trying to control your breathing. It can be done while sitting or walking. Centering prayer. Focus on a word, phrase, or sacred image that means something to you and brings you peace. Deep breathing. Expand your stomach and inhale slowly through your nose. Hold your breath for 3-5 seconds. Then breathe out slowly, letting your stomach muscles relax. Self-talk. Learn to notice and spot thought patterns that lead to anxiety reactions. Change those patterns to thoughts that feel  peaceful. Muscle relaxation. Take time to tense muscles and then relax them. Choose a tension-reduction technique that fits your lifestyle and personality. These techniques take time and practice. Set aside 5-15 minutes a day to do them. Specialized therapists can offer counseling and training in these techniques. The training to help with anxiety may be covered by some insurance plans. Other things you can do to manage stress and anxiety include: Keeping a stress diary. This can help you learn what triggers your reaction and then learn ways to manage your response. Thinking about how you react to certain situations. You may not be able to control everything, but you can control your response. Making time for activities that help you relax and not feeling guilty about spending your time in this way. Doing visual imagery. This involves imagining or creating mental pictures to help you relax. Practicing yoga. Through yoga poses, you can lower tension and relax.  Medicines Medicines for anxiety include: Antidepressant medicines. These are usually prescribed for long-term daily control. Anti-anxiety medicines. These may be added in severe cases, especially when panic attacks occur. When used together, medicines, psychotherapy, and tension-reduction techniques may be the most effective treatment. Relationships Relationships can play a big part in helping you recover. Spend more time connecting with trusted friends and family members. Think about going to couples counseling if you have a partner, taking family education classes, or going to family therapy. Therapy can help you and others better understand your anxiety. How to recognize changes in your anxiety Everyone responds differently to treatment for anxiety. Recovery from anxiety happens when symptoms lessen and stop interfering with  your daily life at home or work. This may mean that you will start to: Have better concentration and focus. Worry  will interfere less in your daily thinking. Sleep better. Be less irritable. Have more energy. Have improved memory. Try to recognize when your condition is getting worse. Contact your provider if your symptoms interfere with home or work and you feel like your condition is not improving. Follow these instructions at home: Activity Exercise. Adults should: Exercise for at least 150 minutes each week. The exercise should increase your heart rate and make you sweat (moderate-intensity exercise). Do strengthening exercises at least twice a week. Get the right amount and quality of sleep. Most adults need 7-9 hours of sleep each night. Lifestyle  Eat a healthy diet that includes plenty of vegetables, fruits, whole grains, low-fat dairy products, and lean protein. Do not eat a lot of foods that are high in fats, added sugars, or salt (sodium). Make choices that simplify your life. Do not use any products that contain nicotine or tobacco. These products include cigarettes, chewing tobacco, and vaping devices, such as e-cigarettes. If you need help quitting, ask your provider. Avoid caffeine, alcohol, and certain over-the-counter cold medicines. These may make you feel worse. Ask your pharmacist which medicines to avoid. General instructions Take over-the-counter and prescription medicines only as told by your provider. Keep all follow-up visits. This is to make sure you are managing your anxiety well or if you need more support. Where to find support You can get help and support from: Self-help groups. Online and [employer]. A trusted spiritual leader. Couples counseling. Family education classes. Family therapy. Where to find more information You may find that joining a support group helps you deal with your anxiety. The following sources can help you find counselors or support groups near you: Mental Health America: mentalhealthamerica.net Anxiety and Depression Association  of America (ADAA): adaa.org The [employer] on Mental Illness (NAMI): nami.org Contact a health care provider if: You have a hard time staying focused or finishing tasks. You spend many hours a day feeling worried about everyday life. You are very tired because you cannot stop worrying. You start to have headaches or often feel tense. You have chronic nausea or diarrhea. Get help right away if: Your heart feels like it is racing. You have shortness of breath. You have thoughts of hurting yourself or others. Get help right away if you feel like you may hurt yourself or others, or have thoughts about taking your own life. Go to your nearest emergency room or: Call 911. Call the National Suicide Prevention Lifeline at 762-770-4658 or 988. This is open 24 hours a day. Text the Crisis Text Line at 463-456-6159. This information is not intended to replace advice given to you by your health care provider. Make sure you discuss any questions you have with your health care provider. Document Revised: 10/01/2021 Document Reviewed: 04/15/2020 Elsevier Patient Education  2024 [employer].

## 2023-01-14 ENCOUNTER — Ambulatory Visit (INDEPENDENT_AMBULATORY_CARE_PROVIDER_SITE_OTHER): Payer: BC Managed Care – PPO | Admitting: Nurse Practitioner

## 2023-01-14 ENCOUNTER — Encounter: Payer: Self-pay | Admitting: Nurse Practitioner

## 2023-01-14 VITALS — BP 108/69 | HR 70 | Temp 98.0°F | Wt 159.8 lb

## 2023-01-14 DIAGNOSIS — F32A Depression, unspecified: Secondary | ICD-10-CM

## 2023-01-14 DIAGNOSIS — J301 Allergic rhinitis due to pollen: Secondary | ICD-10-CM | POA: Diagnosis not present

## 2023-01-14 DIAGNOSIS — Z23 Encounter for immunization: Secondary | ICD-10-CM

## 2023-01-14 DIAGNOSIS — F419 Anxiety disorder, unspecified: Secondary | ICD-10-CM

## 2023-01-14 NOTE — Progress Notes (Signed)
 BP 108/69   Pulse 70   Temp 98 F (36.7 C) (Oral)   Wt 159 lb 12.8 oz (72.5 kg)   LMP 12/16/2022   SpO2 98%   BMI 27.43 kg/m    Subjective:    Patient ID: Brittney Knox, female    DOB: 07-Dec-1980, 43 y.o.   MRN: [id number]  HPI: Brittney Knox is a 43 y.o. female  Chief Complaint  Patient presents with   Depression   Recent illness, does use Albuterol  as needed.  DEPRESSION & ANXIETY Has not taken medication in 2 months and cut back on alcohol use + changing diet.  Overall feeling better, still has occasional anxiety but nothing terrible.  Was on Prozac  and Buspar .  Tried Celexa  in past without benefit. No longer caring for father, passed away. Mood status: stable Satisfied with current treatment?: yes Symptom severity: mild  Duration of current treatment : chronic Side effects: no Medication compliance: good compliance Psychotherapy/counseling: yes in the past Depressed mood: no Anxious mood: no Anhedonia: no Significant weight loss or gain: no Insomnia: none Fatigue: no Feelings of worthlessness or guilt: no Impaired concentration/indecisiveness: no Suicidal ideations: no Hopelessness: no Crying spells: no    01/14/2023    3:46 PM 12/22/2022    2:33 PM 12/18/2022    4:42 PM 08/15/2022    3:00 PM 02/12/2022    2:58 PM  Depression screen PHQ 2/9  Decreased Interest 0 0 0 0 1  Down, Depressed, Hopeless 0 0 0 1 1  PHQ - 2 Score 0 0 0 1 2  Altered sleeping 1 2 2 3 3   Tired, decreased energy 1 2 2 2 2   Change in appetite 0 0 0 0 0  Feeling bad or failure about yourself  0 0 0 0 0  Trouble concentrating 1 2 2 1 2   Moving slowly or fidgety/restless 0 0 0 0 0  Suicidal thoughts 0 0 0 0 0  PHQ-9 Score 3 6 6 7 9   Difficult doing work/chores Somewhat difficult Not difficult at all Not difficult at all Not difficult at all Somewhat difficult       01/14/2023    3:47 PM 12/22/2022    2:34 PM 12/18/2022    4:43 PM 08/15/2022    3:01 PM  GAD 7 : Generalized Anxiety  Score  Nervous, Anxious, on Edge 1 2 2 3   Control/stop worrying 0 0 1 2  Worry too much - different things 1 0 0 2  Trouble relaxing 1 2 2 3   Restless 0 2 2 2   Easily annoyed or irritable 1 1 2  0  Afraid - awful might happen 0 0 0 0  Total GAD 7 Score 4 7 9 12   Anxiety Difficulty Somewhat difficult Somewhat difficult Somewhat difficult Not difficult at all     Relevant past medical, surgical, family and social history reviewed and updated as indicated. Interim medical history since our last visit reviewed. Allergies and medications reviewed and updated.  Review of Systems  Constitutional:  Negative for activity change, appetite change, diaphoresis, fatigue and fever.  Respiratory:  Negative for cough, chest tightness and shortness of breath.   Cardiovascular:  Negative for chest pain, palpitations and leg swelling.  Neurological: Negative.   Psychiatric/Behavioral:  Negative for decreased concentration, self-injury, sleep disturbance and suicidal ideas. The patient is nervous/anxious.     Per HPI unless specifically indicated above     Objective:    BP 108/69   Pulse 70  Temp 98 F (36.7 C) (Oral)   Wt 159 lb 12.8 oz (72.5 kg)   LMP 12/16/2022   SpO2 98%   BMI 27.43 kg/m   Wt Readings from Last 3 Encounters:  01/14/23 159 lb 12.8 oz (72.5 kg)  12/22/22 164 lb 12.8 oz (74.8 kg)  12/18/22 163 lb 6.4 oz (74.1 kg)    Physical Exam Vitals and nursing note reviewed.  Constitutional:      General: She is awake. She is not in acute distress.    Appearance: She is well-developed and well-groomed. She is not ill-appearing or toxic-appearing.  HENT:     Head: Normocephalic.     Right Ear: Hearing and external ear normal.     Left Ear: Hearing and external ear normal.  Eyes:     General: Lids are normal.        Right eye: No discharge.        Left eye: No discharge.     Conjunctiva/sclera: Conjunctivae normal.     Pupils: Pupils are equal, round, and reactive to light.   Neck:     Thyroid : No thyromegaly.     Vascular: No carotid bruit.  Cardiovascular:     Rate and Rhythm: Normal rate and regular rhythm.     Heart sounds: Normal heart sounds. No murmur heard.    No gallop.  Pulmonary:     Effort: Pulmonary effort is normal. No accessory muscle usage or respiratory distress.     Breath sounds: Normal breath sounds.  Abdominal:     General: Bowel sounds are normal. There is no distension.     Palpations: Abdomen is soft.     Tenderness: There is no abdominal tenderness.  Musculoskeletal:     Cervical back: Normal range of motion and neck supple.     Right lower leg: No edema.     Left lower leg: No edema.  Lymphadenopathy:     Cervical: No cervical adenopathy.  Skin:    General: Skin is warm and dry.  Neurological:     Mental Status: She is alert and oriented to person, place, and time.     Deep Tendon Reflexes: Reflexes are normal and symmetric.     Reflex Scores:      Brachioradialis reflexes are 2+ on the right side and 2+ on the left side.      Patellar reflexes are 2+ on the right side and 2+ on the left side. Psychiatric:        Attention and Perception: Attention normal.        Mood and Affect: Mood normal.        Speech: Speech normal.        Behavior: Behavior normal. Behavior is cooperative.        Thought Content: Thought content normal.    Results for orders placed or performed in visit on 12/22/22  CBC   Collection Time: 12/22/22  3:05 PM  Result Value Ref Range   WBC 6.1 3.4 - 10.8 x10E3/uL   RBC 3.92 3.77 - 5.28 x10E6/uL   Hemoglobin 13.4 11.1 - 15.9 g/dL   Hematocrit 60.4 65.9 - 46.6 %   MCV 101 (H) 79 - 97 fL   MCH 34.2 (H) 26.6 - 33.0 pg   MCHC 33.9 31.5 - 35.7 g/dL   RDW 88.1 88.2 - 84.5 %   Platelets 203 150 - 450 x10E3/uL      Assessment & Plan:   Problem List Items Addressed This Visit  Respiratory   Allergic rhinitis   Ongoing, obtain spirometry next visit to check for underlying asthma as does  use Albuterol  intermittently.  Provided sample of Airsupra to trial as well.          Other   Anxiety and depression - Primary   Chronic, stable without medication at this time.  Was on Prozac  and Buspar .  Did not benefit from Celexa  in past.  Denies SI/HI.  Has significant family history of anxiety.  ?need for ADD/ADHD testing in future.        Follow up plan: Return for Annual Physical after 02/13/23 -- needs spirometry and pap.

## 2023-01-14 NOTE — Assessment & Plan Note (Signed)
 Ongoing, obtain spirometry next visit to check for underlying asthma as does use Albuterol intermittently.  Provided sample of Airsupra to trial as well.

## 2023-01-14 NOTE — Assessment & Plan Note (Signed)
 Chronic, stable without medication at this time.  Was on Prozac and Buspar.  Did not benefit from Celexa in past.  Denies SI/HI.  Has significant family history of anxiety.  ?need for ADD/ADHD testing in future.

## 2023-02-22 NOTE — Patient Instructions (Incomplete)
 Be Involved in Caring For Your Health:  Taking Medications When medications are taken as directed, they can greatly improve your health. But if they are not taken as prescribed, they may not work. In some cases, not taking them correctly can be harmful. To help ensure your treatment remains effective and safe, understand your medications and how to take them. Bring your medications to each visit for review by your provider.  Your lab results, notes, and after visit summary will be available on My Chart. We strongly encourage you to use this feature. If lab results are abnormal the clinic will contact you with the appropriate steps. If the clinic does not contact you assume the results are satisfactory. You can always view your results on My Chart. If you have questions regarding your health or results, please contact the clinic during office hours. You can also ask questions on My Chart.  We at [facility] are grateful that you chose [place] to provide your care. We strive to provide evidence-based and compassionate care and are always looking for feedback. If you get a survey from the clinic please complete this so we can hear your opinions.  Healthy Eating, Adult Healthy eating may help you get and keep a healthy body weight, reduce the risk of chronic disease, and live a long and productive life. It is important to follow a healthy eating pattern. Your nutritional and calorie needs should be met mainly by different nutrient-rich foods. What are tips for following this plan? Reading food labels Read labels and choose the following: Reduced or low sodium products. Juices with 100% fruit juice. Foods with low saturated fats (<3 g per serving) and high polyunsaturated and monounsaturated fats. Foods with whole grains, such as whole wheat, cracked wheat, brown rice, and wild rice. Whole grains that are fortified with folic acid. This is recommended for females who are pregnant or who want  to become pregnant. Read labels and do not eat or drink the following: Foods or drinks with added sugars. These include foods that contain brown sugar, corn sweetener, corn syrup, dextrose, fructose, glucose, high-fructose corn syrup, honey, invert sugar, lactose, malt syrup, maltose, molasses, raw sugar, sucrose, trehalose, or turbinado sugar. Limit your intake of added sugars to less than 10% of your total daily calories. Do not eat more than the following amounts of added sugar per day: 6 teaspoons (25 g) for females. 9 teaspoons (38 g) for males. Foods that contain processed or refined starches and grains. Refined grain products, such as white flour, degermed cornmeal, white bread, and white rice. Shopping Choose nutrient-rich snacks, such as vegetables, whole fruits, and nuts. Avoid high-calorie and high-sugar snacks, such as potato chips, fruit snacks, and candy. Use oil-based dressings and spreads on foods instead of solid fats such as butter, margarine, sour cream, or cream cheese. Limit pre-made sauces, mixes, and "instant" products such as flavored rice, instant noodles, and ready-made pasta. Try more plant-protein sources, such as tofu, tempeh, black beans, edamame, lentils, nuts, and seeds. Explore eating plans such as the Mediterranean diet or vegetarian diet. Try heart-healthy dips made with beans and healthy fats like hummus and guacamole. Vegetables go great with these. Cooking Use oil to saut or stir-fry foods instead of solid fats such as butter, margarine, or lard. Try baking, boiling, grilling, or broiling instead of frying. Remove the fatty part of meats before cooking. Steam vegetables in water or broth. Meal planning  At meals, imagine dividing your plate into fourths: One-half of  your plate is fruits and vegetables. One-fourth of your plate is whole grains. One-fourth of your plate is protein, especially lean meats, poultry, eggs, tofu, beans, or nuts. Include  low-fat dairy as part of your daily diet. Lifestyle Choose healthy options in all settings, including home, work, school, restaurants, or stores. Prepare your food safely: Wash your hands after handling raw meats. Where you prepare food, keep surfaces clean by regularly washing with hot, soapy water. Keep raw meats separate from ready-to-eat foods, such as fruits and vegetables. Cook seafood, meat, poultry, and eggs to the recommended temperature. Get a food thermometer. Store foods at safe temperatures. In general: Keep cold foods at 76F (4.4C) or below. Keep hot foods at 176F (60C) or above. Keep your freezer at [facility] (-17.8C) or below. Foods are not safe to eat if they have been between the temperatures of 40-176F (4.4-60C) for more than 2 hours. What foods should I eat? Fruits Aim to eat 1-2 cups of fresh, canned (in natural juice), or frozen fruits each day. One cup of fruit equals 1 small apple, 1 large banana, 8 large strawberries, 1 cup (237 g) canned fruit,  cup (82 g) dried fruit, or 1 cup (240 mL) 100% juice. Vegetables Aim to eat 2-4 cups of fresh and frozen vegetables each day, including different varieties and colors. One cup of vegetables equals 1 cup (91 g) broccoli or cauliflower florets, 2 medium carrots, 2 cups (150 g) raw, leafy greens, 1 large tomato, 1 large bell pepper, 1 large sweet potato, or 1 medium white potato. Grains Aim to eat 5-10 ounce-equivalents of whole grains each day. Examples of 1 ounce-equivalent of grains include 1 slice of bread, 1 cup (40 g) ready-to-eat cereal, 3 cups (24 g) popcorn, or  cup (93 g) cooked rice. Meats and other proteins Try to eat 5-7 ounce-equivalents of protein each day. Examples of 1 ounce-equivalent of protein include 1 egg,  oz nuts (12 almonds, 24 pistachios, or 7 walnut halves), 1/4 cup (90 g) cooked beans, 6 tablespoons (90 g) hummus or 1 tablespoon (16 g) peanut butter. A cut of meat or fish that is the size of a deck  of cards is about 3-4 ounce-equivalents (85 g). Of the protein you eat each week, try to have at least 8 sounce (227 g) of seafood. This is about 2 servings per week. This includes salmon, trout, herring, sardines, and anchovies. Dairy Aim to eat 3 cup-equivalents of fat-free or low-fat dairy each day. Examples of 1 cup-equivalent of dairy include 1 cup (240 mL) milk, 8 ounces (250 g) yogurt, 1 ounces (44 g) natural cheese, or 1 cup (240 mL) fortified soy milk. Fats and oils Aim for about 5 teaspoons (21 g) of fats and oils per day. Choose monounsaturated fats, such as canola and olive oils, mayonnaise made with olive oil or avocado oil, avocados, peanut butter, and most nuts, or polyunsaturated fats, such as sunflower, corn, and soybean oils, walnuts, pine nuts, sesame seeds, sunflower seeds, and flaxseed. Beverages Aim for 6 eight-ounce glasses of water per day. Limit coffee to 3-5 eight-ounce cups per day. Limit caffeinated beverages that have added calories, such as soda and energy drinks. If you drink alcohol: Limit how much you have to: 0-1 drink a day if you are female. 0-2 drinks a day if you are female. Know how much alcohol is in your drink. In the [place] one drink is one 12 oz bottle of beer (355 mL), one 5 oz glass of wine (  148 mL), or one 1 oz glass of hard liquor (44 mL). Seasoning and other foods Try not to add too much salt to your food. Try using herbs and spices instead of salt. Try not to add sugar to food. This information is based on [place] nutrition guidelines. To learn more, visit [employer]. Exact amounts may vary. You may need different amounts. This information is not intended to replace advice given to you by your health care provider. Make sure you discuss any questions you have with your health care provider. Document Revised: 09/23/2021 Document Reviewed: 09/23/2021 Elsevier Patient Education  2024 [employer].

## 2023-02-23 DIAGNOSIS — D2372 Other benign neoplasm of skin of left lower limb, including hip: Secondary | ICD-10-CM | POA: Diagnosis not present

## 2023-02-23 DIAGNOSIS — L821 Other seborrheic keratosis: Secondary | ICD-10-CM | POA: Diagnosis not present

## 2023-02-25 ENCOUNTER — Telehealth: Payer: Self-pay

## 2023-02-25 ENCOUNTER — Encounter: Payer: BC Managed Care – PPO | Admitting: Nurse Practitioner

## 2023-02-25 DIAGNOSIS — F5104 Psychophysiologic insomnia: Secondary | ICD-10-CM

## 2023-02-25 DIAGNOSIS — E7800 Pure hypercholesterolemia, unspecified: Secondary | ICD-10-CM

## 2023-02-25 DIAGNOSIS — F419 Anxiety disorder, unspecified: Secondary | ICD-10-CM

## 2023-02-25 DIAGNOSIS — Z862 Personal history of diseases of the blood and blood-forming organs and certain disorders involving the immune mechanism: Secondary | ICD-10-CM

## 2023-02-25 DIAGNOSIS — Z0000 Encounter for general adult medical examination without abnormal findings: Secondary | ICD-10-CM

## 2023-02-25 DIAGNOSIS — I341 Nonrheumatic mitral (valve) prolapse: Secondary | ICD-10-CM

## 2023-02-25 DIAGNOSIS — E559 Vitamin D deficiency, unspecified: Secondary | ICD-10-CM

## 2023-02-25 DIAGNOSIS — J301 Allergic rhinitis due to pollen: Secondary | ICD-10-CM

## 2023-02-25 DIAGNOSIS — F3281 Premenstrual dysphoric disorder: Secondary | ICD-10-CM

## 2023-02-25 DIAGNOSIS — D696 Thrombocytopenia, unspecified: Secondary | ICD-10-CM

## 2023-02-25 DIAGNOSIS — Z124 Encounter for screening for malignant neoplasm of cervix: Secondary | ICD-10-CM

## 2023-02-25 NOTE — Telephone Encounter (Signed)
Ok for [facility] to review.  Left message for patient to return call for appointment needing to be rescheduled due to inclement weather. Please assist her in getting this resolved if/when she returns calls.

## 2023-03-06 ENCOUNTER — Encounter: Payer: Self-pay | Admitting: Nurse Practitioner

## 2023-03-06 ENCOUNTER — Other Ambulatory Visit (HOSPITAL_COMMUNITY)
Admission: RE | Admit: 2023-03-06 | Discharge: 2023-03-06 | Disposition: A | Discharge status: Home / Self Care | Source: Ambulatory Visit | Attending: Nurse Practitioner | Admitting: Nurse Practitioner

## 2023-03-06 ENCOUNTER — Ambulatory Visit (INDEPENDENT_AMBULATORY_CARE_PROVIDER_SITE_OTHER): Payer: BC Managed Care – PPO | Admitting: Nurse Practitioner

## 2023-03-06 VITALS — BP 101/68 | HR 93 | Temp 97.6°F | Ht 65.2 in | Wt 158.2 lb

## 2023-03-06 DIAGNOSIS — E7800 Pure hypercholesterolemia, unspecified: Secondary | ICD-10-CM | POA: Diagnosis not present

## 2023-03-06 DIAGNOSIS — F419 Anxiety disorder, unspecified: Secondary | ICD-10-CM

## 2023-03-06 DIAGNOSIS — E559 Vitamin D deficiency, unspecified: Secondary | ICD-10-CM | POA: Diagnosis not present

## 2023-03-06 DIAGNOSIS — R0981 Nasal congestion: Secondary | ICD-10-CM | POA: Diagnosis not present

## 2023-03-06 DIAGNOSIS — D696 Thrombocytopenia, unspecified: Secondary | ICD-10-CM

## 2023-03-06 DIAGNOSIS — Z124 Encounter for screening for malignant neoplasm of cervix: Secondary | ICD-10-CM | POA: Insufficient documentation

## 2023-03-06 DIAGNOSIS — F3281 Premenstrual dysphoric disorder: Secondary | ICD-10-CM

## 2023-03-06 DIAGNOSIS — Z0000 Encounter for general adult medical examination without abnormal findings: Secondary | ICD-10-CM | POA: Diagnosis not present

## 2023-03-06 DIAGNOSIS — F32A Depression, unspecified: Secondary | ICD-10-CM

## 2023-03-06 DIAGNOSIS — F5104 Psychophysiologic insomnia: Secondary | ICD-10-CM

## 2023-03-06 DIAGNOSIS — Z862 Personal history of diseases of the blood and blood-forming organs and certain disorders involving the immune mechanism: Secondary | ICD-10-CM

## 2023-03-06 NOTE — Assessment & Plan Note (Signed)
 Ongoing.  Noted on past labs, recheck today and initiate supplement as needed.

## 2023-03-06 NOTE — Assessment & Plan Note (Signed)
Refer to anxiety and depression plan of care. 

## 2023-03-06 NOTE — Assessment & Plan Note (Signed)
 Ongoing, no benefit from Melatonin. Continue Trazodone 25-50 MG at night PRN.  Educated her on this medication and use + other options available for sleep aides.  Refills as needed.

## 2023-03-06 NOTE — Progress Notes (Signed)
 BP 101/68   Pulse 93   Temp 97.6 F (36.4 C) (Oral)   Ht 5' 5.2" (1.656 m)   Wt 158 lb 3.2 oz (71.8 kg)   LMP 03/05/2023 (Exact Date)   SpO2 98%   BMI 26.16 kg/m    Subjective:    Patient ID: Brittney Knox, female    DOB: 1980-05-04, 43 y.o.   MRN: [id number]  HPI: Brittney Knox is a 42 y.o. female presenting on 03/06/2023 for comprehensive medical examination. 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  This was on Monday.  Has some muscle aches today and a mild cough, but unsure if she is sick  DEPRESSION Feels this is doing better without medication at this time.  Has Trazodone to take for sleep.  In past took Celexa and Prozac.  Moods change around cycles with more anxiety. Mood status: stable Satisfied with current treatment?: yes Symptom severity: moderate  Duration of current treatment : chronic Side effects: no Medication compliance: good compliance Psychotherapy/counseling: yes in the past Depressed mood: no Anxious mood: yes around cycles Anhedonia: no Significant weight loss or gain: no Insomnia: yes hard to stay asleep at times Fatigue: no Feelings of worthlessness or guilt: no Impaired concentration/indecisiveness: no Suicidal ideations: no Hopelessness: no Crying spells: no    03/06/2023    2:21 PM 01/14/2023    3:46 PM 12/22/2022    2:33 PM 12/18/2022    4:42 PM 08/15/2022    3:00 PM  Depression screen PHQ 2/9  Decreased Interest 0 0 0 0 0  Down, Depressed, Hopeless 1 0 0 0 1  PHQ - 2 Score 1 0 0 0 1  Altered sleeping 1 1 2 2 3   Tired, decreased energy 1 1 2 2 2   Change in appetite 0 0 0 0 0  Feeling bad or failure about yourself  0 0 0 0 0  Trouble concentrating 1 1 2 2 1   Moving slowly or fidgety/restless 0 0 0 0 0  Suicidal thoughts 0 0 0 0 0  PHQ-9 Score 4 3 6 6 7    Difficult doing work/chores Not difficult at all Somewhat difficult Not difficult at all Not difficult at all Not difficult at all       03/06/2023    2:22 PM 01/14/2023    3:47 PM 12/22/2022    2:34 PM 12/18/2022    4:43 PM  GAD 7 : Generalized Anxiety Score  Nervous, Anxious, on Edge 2 1 2 2   Control/stop worrying 0 0 0 1  Worry too much - different things 0 1 0 0  Trouble relaxing 1 1 2 2   Restless 2 0 2 2  Easily annoyed or irritable 0 1 1 2   Afraid - awful might happen 0 0 0 0  Total GAD 7 Score 5 4 7 9   Anxiety Difficulty Not difficult at all Somewhat difficult Somewhat difficult Somewhat difficult      02/12/2022    2:58 PM 08/15/2022    2:59 PM 12/22/2022    2:33 PM 01/14/2023    3:46 PM 03/06/2023    2:21 PM  Fall Risk  Falls in the past year? 0 0 0 0 0  Was there an injury with Fall? 0 0 0 0 0  Fall Risk Category Calculator 0 0 0 0  0  Patient at Risk for Falls Due to No Fall Risks No Fall Risks No Fall Risks No Fall Risks No Fall Risks  Fall risk Follow up Falls evaluation completed Falls evaluation completed  Falls evaluation completed Falls evaluation completed    Functional Status Survey: Is the patient deaf or have difficulty hearing?: No Does the patient have difficulty seeing, even when wearing glasses/contacts?: No Does the patient have difficulty concentrating, remembering, or making decisions?: No Does the patient have difficulty walking or climbing stairs?: No Does the patient have difficulty doing errands alone such as visiting a doctor's office or shopping?: No    Past Medical History:  Past Medical History:  Diagnosis Date   Anemia    BRCA negative 12/2012   MyRisk neg; CDKN2A VUS   Family history of breast cancer 12/2012   IBIS=15%   Family history of ovarian cancer    GERD (gastroesophageal reflux disease)    OCC -NO MEDS   LGSIL on Pap smear of cervix     Surgical History:  Past Surgical History:  Procedure Laterality Date   CESAREAN SECTION      X2   DILITATION & CURRETTAGE/HYSTROSCOPY WITH NOVASURE ABLATION N/A 02/11/2018   Procedure: DILATATION & CURETTAGE/HYSTEROSCOPY WITH ENDOMETRIAL ABLATION - MINERVA & ACCESS TO NOVASURE;  Surgeon: Nadara Mustard, MD;  Location: [facility] ORS;  Service: Gynecology;  Laterality: N/A;   TONSILLECTOMY      Medications:  Current Outpatient Medications on File Prior to Visit  Medication Sig   albuterol (VENTOLIN HFA) 108 (90 Base) MCG/ACT inhaler Inhale 2 puffs into the lungs every 6 (six) hours as needed for wheezing or shortness of breath.   traZODone (DESYREL) 50 MG tablet Take 0.5-1 tablets (25-50 mg total) by mouth at bedtime as needed for sleep.   No current facility-administered medications on file prior to visit.    Allergies:  Allergies  Allergen Reactions   Morphine And Codeine Itching    Social History:  Social History   Socioeconomic History   Marital status: Married    Spouse name: Not on file   Number of children: Not on file   Years of education: Not on file   Highest education level: Not on file  Occupational History   Not on file  Tobacco Use   Smoking status: Never   Smokeless tobacco: Never  Vaping Use   Vaping status: Never Used  Substance and Sexual Activity   Alcohol use: Yes    Comment: SOCIAL   Drug use: No   Sexual activity: Yes    Birth control/protection: None  Other Topics Concern   Not on file  Social History Narrative   Not on file   Social Drivers of Health   Financial Resource Strain: Low Risk  (03/06/2023)   Overall Financial Resource Strain (CARDIA)    Difficulty of Paying Living Expenses: Not hard at all  Food Insecurity: No Food Insecurity (03/06/2023)   Hunger Vital Sign    Worried About Running Out of Food in the Last Year: Never true    Ran Out of Food in the Last Year: Never true  Transportation Needs: No Transportation Needs (03/06/2023)   PRAPARE - Transportation    Lack of Transportation (Medical): No    Lack of  Transportation (Non-Medical): No  Physical Activity: Sufficiently Active (03/06/2023)   Exercise Vital Sign    Days of Exercise per Week: 3 days    Minutes of Exercise per Session: 60 min  Stress: No Stress  Concern Present (03/06/2023)   [employer] of Occupational Health - Occupational Stress Questionnaire    Feeling of Stress : Not at all  Social Connections: Moderately Integrated (03/06/2023)   Social Connection and Isolation Panel [NHANES]    Frequency of Communication with Friends and Family: More than three times a week    Frequency of Social Gatherings with Friends and Family: Three times a week    Attends Religious Services: More than 4 times per year    Active Member of Clubs or Organizations: No    Attends Banker Meetings: Never    Marital Status: Married  Catering manager Violence: Not At Risk (03/06/2023)   Humiliation, Afraid, Rape, and Kick questionnaire    Fear of Current or Ex-Partner: No    Emotionally Abused: No    Physically Abused: No    Sexually Abused: No   Social History   Tobacco Use  Smoking Status Never  Smokeless Tobacco Never   Social History   Substance and Sexual Activity  Alcohol Use Yes   Comment: SOCIAL    Family History:  Family History  Problem Relation Age of Onset   Arthritis Mother        RA   Heart disease Mother    Hypothyroidism Mother    Heart disease Father        A fib   Diabetes Father    Hypothyroidism Sister    Breast cancer Maternal Grandmother 55       and 50   Diabetes Maternal Grandfather    Ovarian cancer Paternal Grandmother 59   Breast cancer Cousin    Breast cancer Maternal Aunt     Past medical history, surgical history, medications, allergies, family history and social history reviewed with patient today and changes made to appropriate areas of the chart.   ROS All other ROS negative except what is listed above and in the HPI.      Objective:    BP 101/68   Pulse 93   Temp 97.6 F  (36.4 C) (Oral)   Ht 5' 5.2" (1.656 m)   Wt 158 lb 3.2 oz (71.8 kg)   LMP 03/05/2023 (Exact Date)   SpO2 98%   BMI 26.16 kg/m   Wt Readings from Last 3 Encounters:  03/06/23 158 lb 3.2 oz (71.8 kg)  01/14/23 159 lb 12.8 oz (72.5 kg)  12/22/22 164 lb 12.8 oz (74.8 kg)    Physical Exam Vitals and nursing note reviewed. Exam conducted with a chaperone present.  Constitutional:      General: She is awake. She is not in acute distress.    Appearance: She is well-developed and well-groomed. She is not ill-appearing or toxic-appearing.  HENT:     Head: Normocephalic and atraumatic.     Right Ear: Hearing, ear canal and external ear normal. No drainage. A middle ear effusion is present. There is no impacted cerumen. Tympanic membrane is not injected.     Left Ear: Hearing, ear canal and external ear normal. No drainage. A middle ear effusion is present. There is no impacted cerumen. Tympanic membrane is not injected.     Nose: Nose normal. No rhinorrhea.     Right Sinus: No maxillary sinus tenderness or frontal sinus tenderness.     Left Sinus: No maxillary sinus tenderness or frontal sinus tenderness.     Mouth/Throat:     Mouth: Mucous membranes are moist.     Pharynx: Oropharynx is clear. Uvula midline. Posterior oropharyngeal erythema (  mild) present. No pharyngeal swelling or oropharyngeal exudate.  Eyes:     General: Lids are normal.        Right eye: No discharge.        Left eye: No discharge.     Extraocular Movements: Extraocular movements intact.     Conjunctiva/sclera: Conjunctivae normal.     Pupils: Pupils are equal, round, and reactive to light.     Visual Fields: Right eye visual fields normal and left eye visual fields normal.  Neck:     Thyroid: No thyromegaly.     Vascular: No carotid bruit.     Trachea: Trachea normal.  Cardiovascular:     Rate and Rhythm: Normal rate and regular rhythm.     Heart sounds: Normal heart sounds. No murmur heard.    No gallop.   Pulmonary:     Effort: Pulmonary effort is normal. No accessory muscle usage or respiratory distress.     Breath sounds: Normal breath sounds.  Chest:  Breasts:    Right: Normal.     Left: Normal.  Abdominal:     General: Bowel sounds are normal.     Palpations: Abdomen is soft. There is no hepatomegaly or splenomegaly.     Tenderness: There is no abdominal tenderness.     Hernia: There is no hernia in the left inguinal area or right inguinal area.  Genitourinary:    Exam position: Lithotomy position.     Labia:        Right: No rash.        Left: No rash.      Urethra: No prolapse.     Vagina: Normal.     Cervix: Normal.     Uterus: Normal.      Adnexa: Right adnexa normal and left adnexa normal.     Comments: Cervix anterior and viewed.  Some old sang present and swabbed for removal.  Pap obtained. Musculoskeletal:        General: Normal range of motion.     Cervical back: Normal range of motion and neck supple.     Right lower leg: No edema.     Left lower leg: No edema.  Lymphadenopathy:     Head:     Right side of head: No submental, submandibular, tonsillar, preauricular or posterior auricular adenopathy.     Left side of head: No submental, submandibular, tonsillar, preauricular or posterior auricular adenopathy.     Cervical: No cervical adenopathy.     Upper Body:     Right upper body: No supraclavicular, axillary or pectoral adenopathy.     Left upper body: No supraclavicular, axillary or pectoral adenopathy.  Skin:    General: Skin is warm and dry.     Capillary Refill: Capillary refill takes less than 2 seconds.     Findings: No rash.  Neurological:     Mental Status: She is alert and oriented to person, place, and time.     Gait: Gait is intact.     Deep Tendon Reflexes: Reflexes are normal and symmetric.     Reflex Scores:      Brachioradialis reflexes are 2+ on the right side and 2+ on the left side.      Patellar reflexes are 2+ on the right side and 2+  on the left side. Psychiatric:        Attention and Perception: Attention normal.        Mood and Affect: Mood normal.  Speech: Speech normal.        Behavior: Behavior normal. Behavior is cooperative.        Thought Content: Thought content normal.        Judgment: Judgment normal.    Results for orders placed or performed in visit on 12/22/22  CBC   Collection Time: 12/22/22  3:05 PM  Result Value Ref Range   WBC 6.1 3.4 - 10.8 x10E3/uL   RBC 3.92 3.77 - 5.28 x10E6/uL   Hemoglobin 13.4 11.1 - 15.9 g/dL   Hematocrit 16.1 09.6 - 46.6 %   MCV 101 (H) 79 - 97 fL   MCH 34.2 (H) 26.6 - 33.0 pg   MCHC 33.9 31.5 - 35.7 g/dL   RDW 04.5 40.9 - 81.1 %   Platelets 203 150 - 450 x10E3/uL      Assessment & Plan:   Problem List Items Addressed This Visit       Hematopoietic and Hemostatic   Thrombocytopenia (HCC) - Primary   Stable.  Recheck CBC today -- ongoing since 2017 = 120-137 on labs.      Relevant Orders   Ferritin   Iron     Other   Anxiety and depression   Chronic, stable without medication at this time.  Was on Prozac and Buspar.  Did not benefit from Celexa in past.  Denies SI/HI.  Has significant family history of anxiety.  ?need for ADD/ADHD testing in future.      Relevant Orders   TSH   Elevated low density lipoprotein (LDL) cholesterol level   Ongoing. Noted past labs, recheck today and initiate medication as needed.  Lipid panel and CMP today.      Relevant Orders   Comprehensive metabolic panel   Lipid Panel w/o Chol/HDL Ratio   History of iron deficiency anemia   History of on past labs -- check CBC, iron, ferritin today.      Relevant Orders   CBC with Differential/Platelet   Ferritin   Iron   Insomnia   Ongoing, no benefit from Melatonin. Continue Trazodone 25-50 MG at night PRN.  Educated her on this medication and use + other options available for sleep aides.  Refills as needed.      PMDD (premenstrual dysphoric disorder)   Refer to  anxiety and depression plan of care.      Vitamin D deficiency   Ongoing.  Noted on past labs, recheck today and initiate supplement as needed.        Relevant Orders   VITAMIN D 25 Hydroxy (Vit-D Deficiency, Fractures)   Other Visit Diagnoses       Congestion of nasal sinus       Flu negative, continue to monitor symptoms and alert PCP if any worsening.   Relevant Orders   Influenza A & B (STAT)     Cervical cancer screening       Pap obtained and sent to lab.   Relevant Orders   Cytology - PAP     Encounter for annual physical exam       Annual physical today, health maintenance reviewed with patient.        Follow up plan: Return in about 1 year (around 03/05/2024) for Annual Physical.   LABORATORY TESTING:  - Pap smear: performed today  IMMUNIZATIONS:   - Tdap: Tetanus vaccination status reviewed: last tetanus booster within 10 years. - Influenza: Refused - Pneumovax: Not applicable - Prevnar: Not applicable - COVID: Up to date - HPV:  Not applicable - Shingrix vaccine: Not applicable  SCREENING: -Mammogram: Up To Date -- due next 12/24/23 - Colonoscopy: Not applicable  - Bone Density: Not applicable  -Hearing Test: Not applicable  -Spirometry: Not applicable   PATIENT COUNSELING:   Advised to take 1 mg of folate supplement per day if capable of pregnancy.   Sexuality: Discussed sexually transmitted diseases, partner selection, use of condoms, avoidance of unintended pregnancy  and contraceptive alternatives.   Advised to avoid cigarette smoking.  I discussed with the patient that most people either abstain from alcohol or drink within safe limits (<=14/week and <=4 drinks/occasion for males, <=7/weeks and <= 3 drinks/occasion for females) and that the risk for alcohol disorders and other health effects rises proportionally with the number of drinks per week and how often a drinker exceeds daily limits.  Discussed cessation/primary prevention of drug use  and availability of treatment for abuse.   Diet: Encouraged to adjust caloric intake to maintain  or achieve ideal body weight, to reduce intake of dietary saturated fat and total fat, to limit sodium intake by avoiding high sodium foods and not adding table salt, and to maintain adequate dietary potassium and calcium preferably from fresh fruits, vegetables, and low-fat dairy products.    Stressed the importance of regular exercise  Injury prevention: Discussed safety belts, safety helmets, smoke detector, smoking near bedding or upholstery.   Dental health: Discussed importance of regular tooth brushing, flossing, and dental visits.    NEXT PREVENTATIVE PHYSICAL DUE IN 1 YEAR. Return in about 1 year (around 03/05/2024) for Annual Physical.

## 2023-03-06 NOTE — Patient Instructions (Signed)

## 2023-03-06 NOTE — Assessment & Plan Note (Signed)
 Chronic, stable without medication at this time.  Was on Prozac and Buspar.  Did not benefit from Celexa in past.  Denies SI/HI.  Has significant family history of anxiety.  ?need for ADD/ADHD testing in future.

## 2023-03-06 NOTE — Assessment & Plan Note (Signed)
History of on past labs -- check CBC, iron, ferritin today. 

## 2023-03-06 NOTE — Assessment & Plan Note (Signed)
 Stable.  Recheck CBC today -- ongoing since 2017 = 120-137 on labs.

## 2023-03-06 NOTE — Assessment & Plan Note (Signed)
 Ongoing. Noted past labs, recheck today and initiate medication as needed.  Lipid panel and CMP today.

## 2023-03-07 ENCOUNTER — Encounter: Payer: Self-pay | Admitting: Nurse Practitioner

## 2023-03-07 ENCOUNTER — Other Ambulatory Visit: Payer: Self-pay | Admitting: Nurse Practitioner

## 2023-03-07 DIAGNOSIS — Z862 Personal history of diseases of the blood and blood-forming organs and certain disorders involving the immune mechanism: Secondary | ICD-10-CM

## 2023-03-07 DIAGNOSIS — D696 Thrombocytopenia, unspecified: Secondary | ICD-10-CM

## 2023-03-07 LAB — COMPREHENSIVE METABOLIC PANEL
ALT: 14 IU/L (ref 0–32)
AST: 19 IU/L (ref 0–40)
Albumin: 5.1 g/dL — ABNORMAL HIGH (ref 3.9–4.9)
Alkaline Phosphatase: 62 IU/L (ref 44–121)
BUN/Creatinine Ratio: 18 (ref 9–23)
BUN: 13 mg/dL (ref 6–24)
Bilirubin Total: 1.1 mg/dL (ref 0.0–1.2)
CO2: 24 mmol/L (ref 20–29)
Calcium: 9.7 mg/dL (ref 8.7–10.2)
Chloride: 101 mmol/L (ref 96–106)
Creatinine, Ser: 0.74 mg/dL (ref 0.57–1.00)
Globulin, Total: 2.3 g/dL (ref 1.5–4.5)
Glucose: 83 mg/dL (ref 70–99)
Potassium: 4.1 mmol/L (ref 3.5–5.2)
Sodium: 140 mmol/L (ref 134–144)
Total Protein: 7.4 g/dL (ref 6.0–8.5)
eGFR: 105 mL/min/{1.73_m2} (ref >59)

## 2023-03-07 LAB — CBC WITH DIFFERENTIAL/PLATELET
Basophils Absolute: 0 10*3/uL (ref 0.0–0.2)
Basos: 0 %
EOS (ABSOLUTE): 0.2 10*3/uL (ref 0.0–0.4)
Eos: 2 %
Hematocrit: 41.7 % (ref 34.0–46.6)
Hemoglobin: 14.7 g/dL (ref 11.1–15.9)
Immature Grans (Abs): 0 10*3/uL (ref 0.0–0.1)
Immature Granulocytes: 0 %
Lymphocytes Absolute: 0.5 10*3/uL — ABNORMAL LOW (ref 0.7–3.1)
Lymphs: 8 %
MCH: 34.5 pg — ABNORMAL HIGH (ref 26.6–33.0)
MCHC: 35.3 g/dL (ref 31.5–35.7)
MCV: 98 fL — ABNORMAL HIGH (ref 79–97)
Monocytes Absolute: 0.4 10*3/uL (ref 0.1–0.9)
Monocytes: 5 %
Neutrophils Absolute: 5.7 10*3/uL (ref 1.4–7.0)
Neutrophils: 85 %
Platelets: 142 10*3/uL — ABNORMAL LOW (ref 150–450)
RBC: 4.26 x10E6/uL (ref 3.77–5.28)
RDW: 11.7 % (ref 11.7–15.4)
WBC: 6.7 10*3/uL (ref 3.4–10.8)

## 2023-03-07 LAB — FERRITIN: Ferritin: 172 ng/mL — ABNORMAL HIGH (ref 15–150)

## 2023-03-07 LAB — LIPID PANEL W/O CHOL/HDL RATIO
Cholesterol, Total: 226 mg/dL — ABNORMAL HIGH (ref 100–199)
HDL: 63 mg/dL (ref >39)
LDL Chol Calc (NIH): 142 mg/dL — ABNORMAL HIGH (ref 0–99)
Triglycerides: 118 mg/dL (ref 0–149)
VLDL Cholesterol Cal: 21 mg/dL (ref 5–40)

## 2023-03-07 LAB — TSH: TSH: 1.8 u[IU]/mL (ref 0.450–4.500)

## 2023-03-07 LAB — IRON: Iron: 23 ug/dL — ABNORMAL LOW (ref 27–159)

## 2023-03-07 LAB — VITAMIN D 25 HYDROXY (VIT D DEFICIENCY, FRACTURES): Vit D, 25-Hydroxy: 24.7 ng/mL — ABNORMAL LOW (ref 30.0–100.0)

## 2023-03-07 NOTE — Progress Notes (Signed)
 Contacted via MyChart -- lab only visit in 8 weeks please   Good morning Brittney Knox, your labs have returned: - CBC overall stable, mildly low platelets, which we have seen in past too and will continue to monitor. - Iron level a little low and ferritin a little high, showing some iron deficiency again.  Please take iron supplement daily (like Slow Fe or Vitron).  Staff will call and we will recheck outpatient labs in 8 weeks.:) - Kidney function, creatinine and eGFR, remains normal, as is liver function, AST and ALT.  - Lipid panel shows LDL trending down some.  Good news!! - Vitamin D remains a little low, please ensure to take Vitamin D3 2000 units daily. - TSH, thyroid, is normal.  Any questions? Keep being incredible!!  Thank you for allowing me to participate in your care.  I appreciate you. Kindest regards, Luda Charbonneau

## 2023-03-10 LAB — VERITOR FLU A/B WAIVED
Influenza A: NEGATIVE
Influenza B: NEGATIVE

## 2023-03-10 NOTE — Progress Notes (Signed)
 Called patient left voice mail asking patient to call back and schedule 8 week follow up for labs

## 2023-03-12 LAB — CYTOLOGY - PAP
Adequacy: ABSENT
Comment: NEGATIVE
Diagnosis: NEGATIVE
High risk HPV: NEGATIVE

## 2023-03-12 NOTE — Progress Notes (Signed)
 Pap testing all negative.  Great news!!  Repeat in 5 years:)

## 2023-03-26 ENCOUNTER — Other Ambulatory Visit

## 2023-05-26 ENCOUNTER — Other Ambulatory Visit

## 2023-05-26 DIAGNOSIS — D696 Thrombocytopenia, unspecified: Secondary | ICD-10-CM | POA: Diagnosis not present

## 2023-05-26 DIAGNOSIS — Z862 Personal history of diseases of the blood and blood-forming organs and certain disorders involving the immune mechanism: Secondary | ICD-10-CM

## 2023-05-27 ENCOUNTER — Ambulatory Visit: Payer: Self-pay | Admitting: Nurse Practitioner

## 2023-05-27 LAB — CBC WITH DIFFERENTIAL/PLATELET
Basophils Absolute: 0.1 10*3/uL (ref 0.0–0.2)
Basos: 1 %
EOS (ABSOLUTE): 0.1 10*3/uL (ref 0.0–0.4)
Eos: 3 %
Hematocrit: 43.7 % (ref 34.0–46.6)
Hemoglobin: 14.4 g/dL (ref 11.1–15.9)
Immature Grans (Abs): 0 10*3/uL (ref 0.0–0.1)
Immature Granulocytes: 0 %
Lymphocytes Absolute: 1.3 10*3/uL (ref 0.7–3.1)
Lymphs: 25 %
MCH: 33.7 pg — ABNORMAL HIGH (ref 26.6–33.0)
MCHC: 33 g/dL (ref 31.5–35.7)
MCV: 102 fL — ABNORMAL HIGH (ref 79–97)
Monocytes Absolute: 0.4 10*3/uL (ref 0.1–0.9)
Monocytes: 7 %
Neutrophils Absolute: 3.3 10*3/uL (ref 1.4–7.0)
Neutrophils: 64 %
Platelets: 146 10*3/uL — ABNORMAL LOW (ref 150–450)
RBC: 4.27 x10E6/uL (ref 3.77–5.28)
RDW: 11.8 % (ref 11.7–15.4)
WBC: 5.2 10*3/uL (ref 3.4–10.8)

## 2023-05-27 LAB — FERRITIN: Ferritin: 153 ng/mL — ABNORMAL HIGH (ref 15–150)

## 2023-05-27 LAB — IRON: Iron: 111 ug/dL (ref 27–159)

## 2023-05-27 LAB — VITAMIN B12: Vitamin B-12: 411 pg/mL (ref 232–1245)

## 2023-05-27 NOTE — Progress Notes (Signed)
 Contacted via MyChart   Good afternoon Brittney Knox, your labs have returned.  Platelets continue to be a little on low side, this fluctuates on your labs.  Ferritin a little elevated and iron level now much improved.  I would change taking iron supplement to every other day.  Any questions? Keep being amazing!!  Thank you for allowing me to participate in your care.  I appreciate you. Kindest regards, Erryn Dickison

## 2023-08-04 IMAGING — US US PELVIS COMPLETE WITH TRANSVAGINAL
1 series · 14 of 25 positions shown · non-contrast
Comparison: 11/06/2017

CLINICAL DATA: RIGHT lower quadrant abdominal pain, constant pain
for 3 weeks, LMP 04/30/2021



[Series 1: us pelvis complete with transvaginal · 0.21mm/px · 14 of 108 slices shown]
[im 1/108]
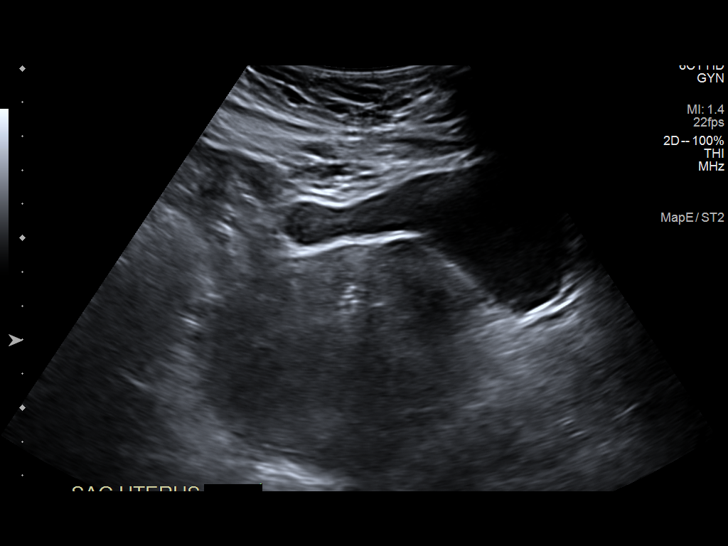
[im 9/108]
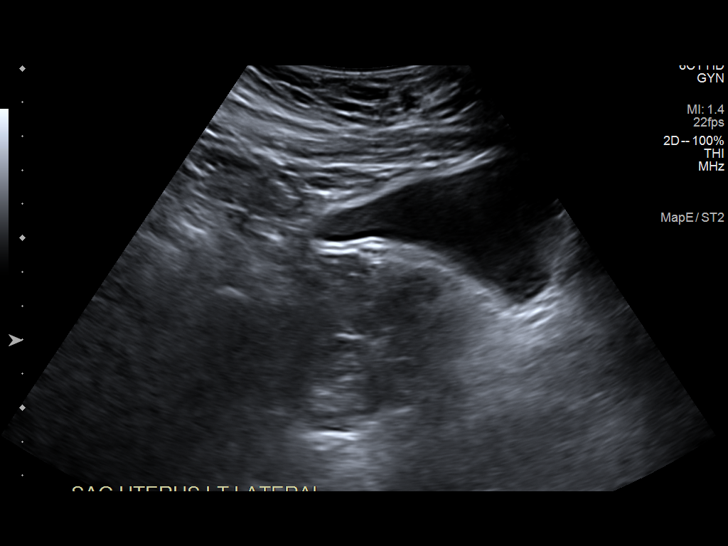
[im 18/108]
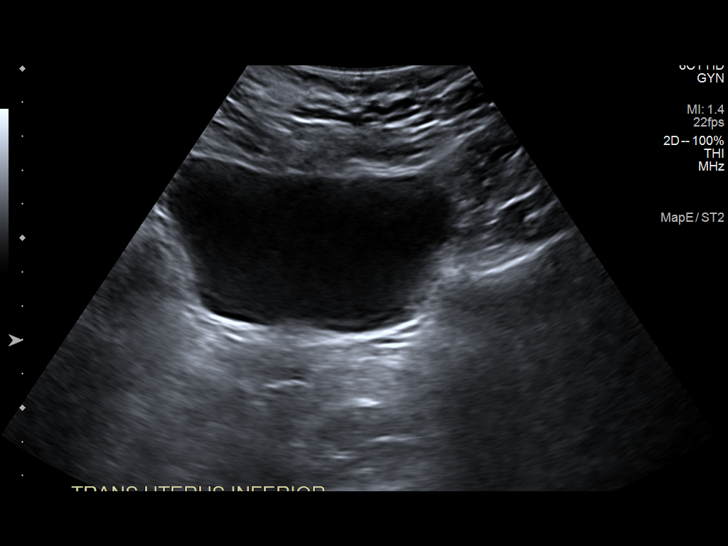
[im 27/108]
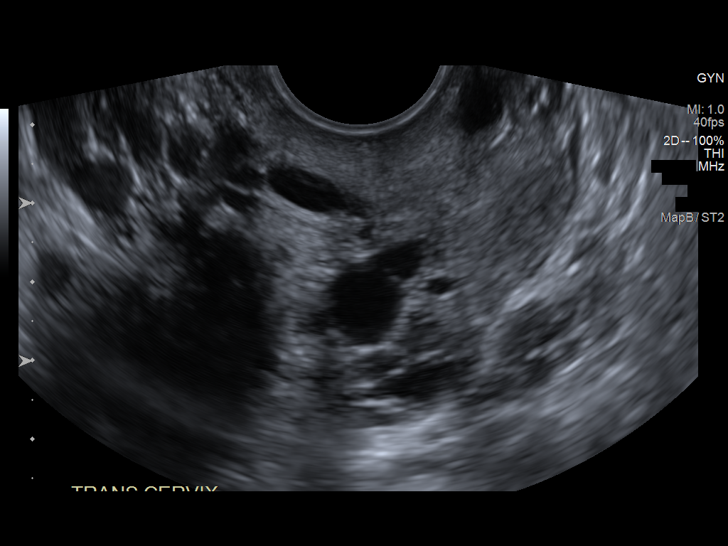
[im 36/108]
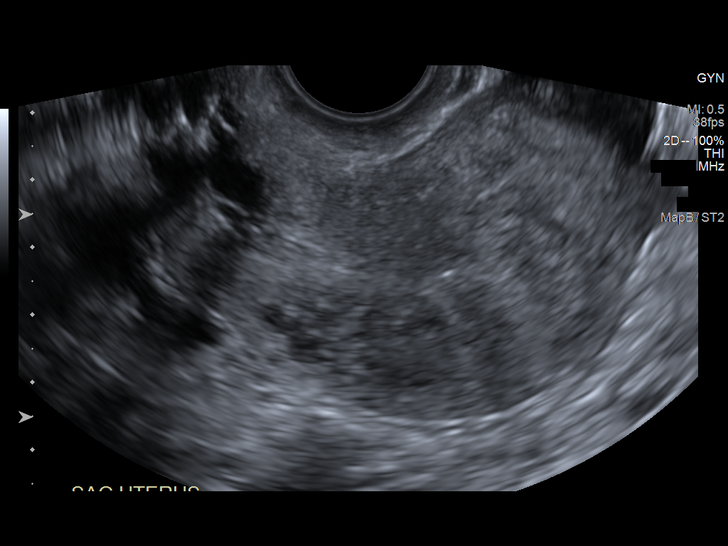
[im 41/108]
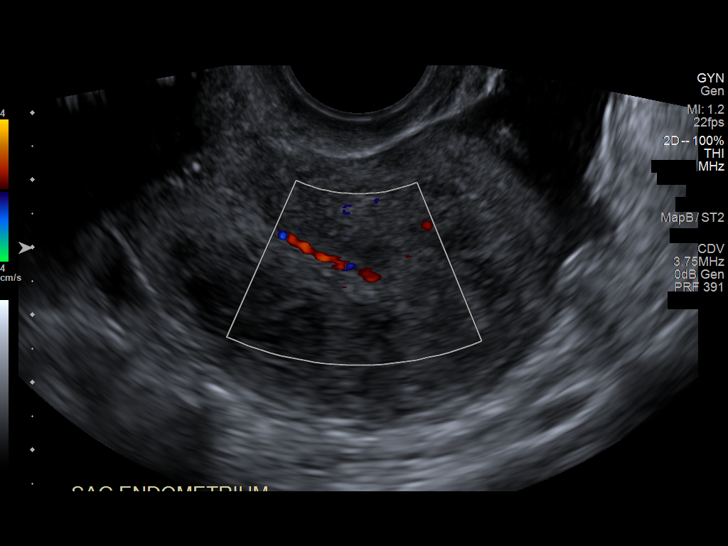
[im 50/108]
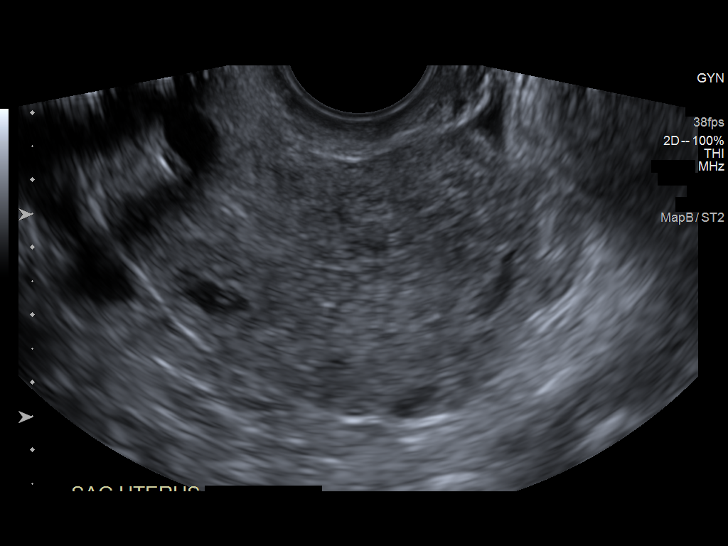
[im 58/108]
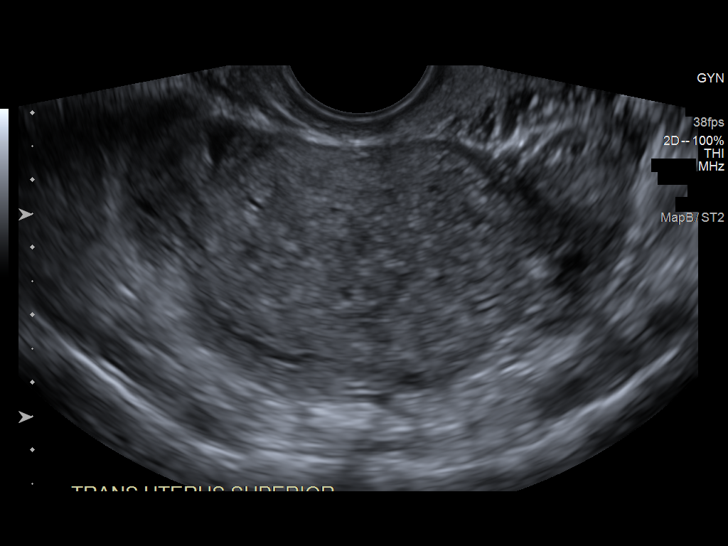
[im 67/108]
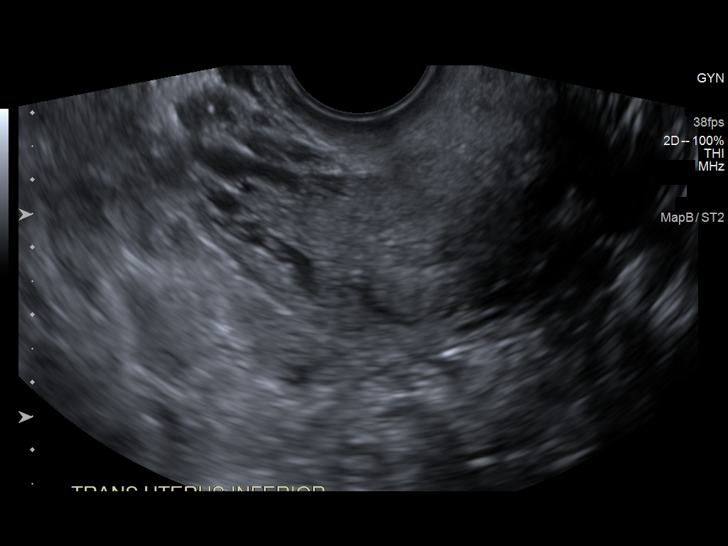
[im 72/108]
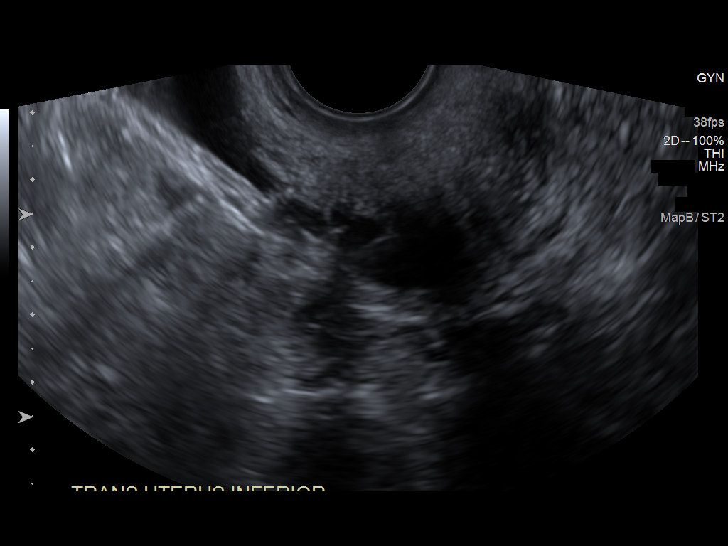
[im 81/108]
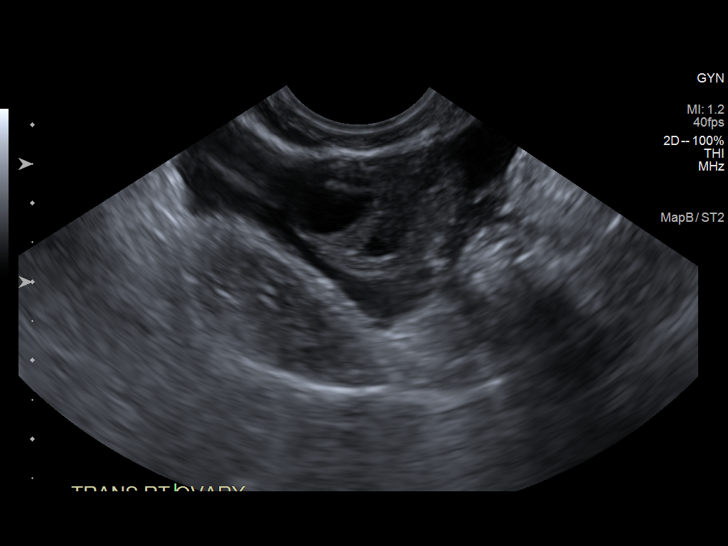
[im 90/108]
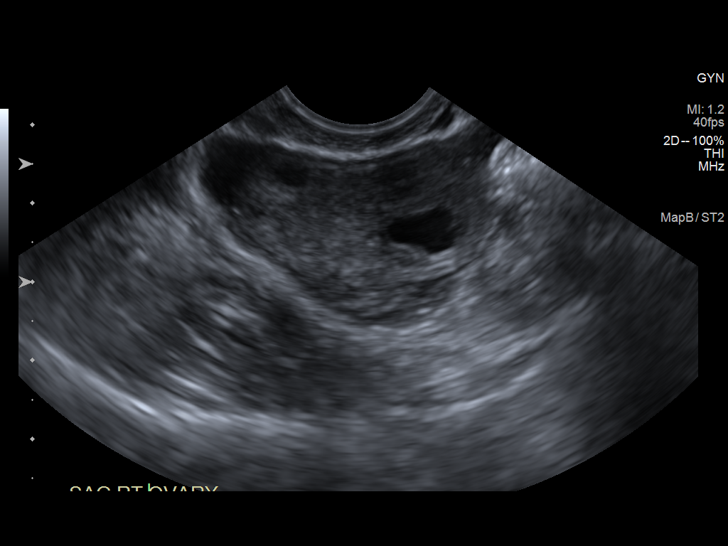
[im 99/108]
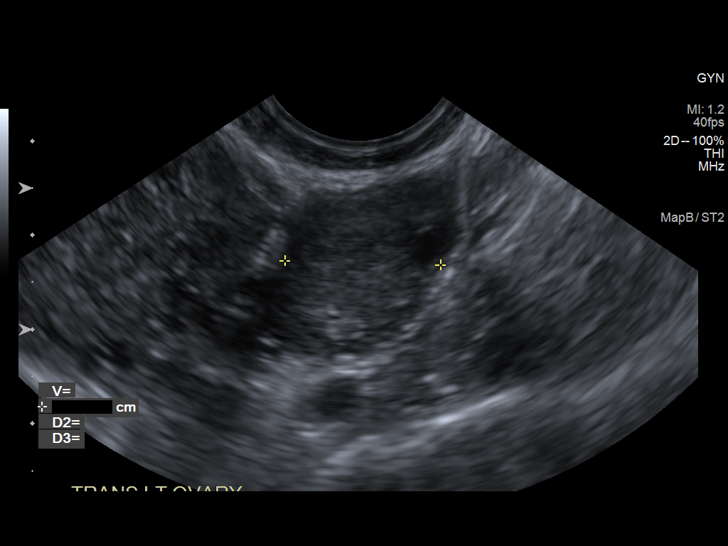
[im 108/108]
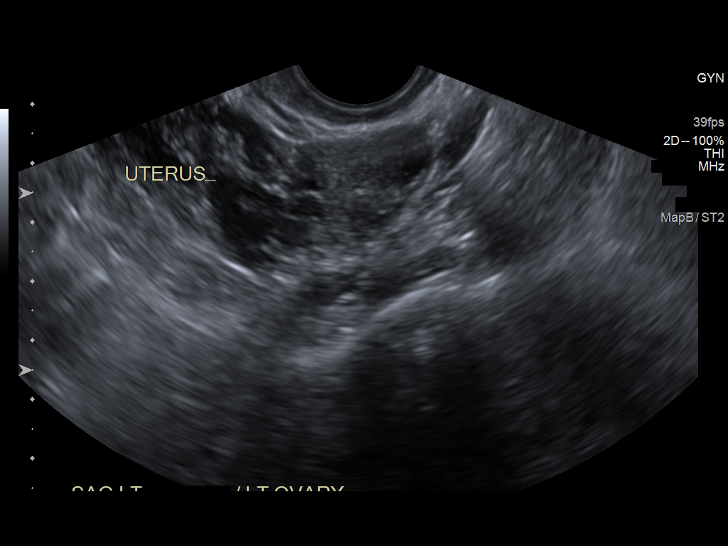

[14 of 25 positions shown; findings below may reference images not displayed]

FINDINGS: Uterus

Measurements: 7.8 x 4.1 x 5.7 cm = volume: 96 mL. Retroverted.
Nabothian cysts at cervix. No focal uterine mass.

Endometrium

Thickness: 8 mm.  No endometrial fluid or mass

Right ovary

Measurements: 3.7 x 2.2 x 2.4 cm = volume: 9.9 mL. Normal morphology
without mass

Left ovary

Measurements: 3.1 x 1.7 x 1.7 cm = volume: 4.6 mL. Normal morphology
without mass

Other findings

Trace free pelvic fluid.  No adnexal masses.
IMPRESSION: No pelvic sonographic abnormalities identified.

## 2023-09-15 ENCOUNTER — Other Ambulatory Visit: Payer: Self-pay | Admitting: Nurse Practitioner

## 2023-09-15 DIAGNOSIS — Z1231 Encounter for screening mammogram for malignant neoplasm of breast: Secondary | ICD-10-CM

## 2023-09-21 ENCOUNTER — Telehealth: Admitting: Family Medicine

## 2023-09-21 DIAGNOSIS — J019 Acute sinusitis, unspecified: Secondary | ICD-10-CM | POA: Diagnosis not present

## 2023-09-21 DIAGNOSIS — B9689 Other specified bacterial agents as the cause of diseases classified elsewhere: Secondary | ICD-10-CM

## 2023-09-21 MED ORDER — AMOXICILLIN-POT CLAVULANATE 875-125 MG PO TABS: 1.0000 | ORAL_TABLET | Freq: Two times a day (BID) | ORAL | 0 refills | Status: DC

## 2023-09-21 NOTE — Progress Notes (Signed)
 Virtual Visit Consent   Brittney Knox, you are scheduled for a virtual visit with a [facility] provider today. Just as with appointments in the office, your consent must be obtained to participate. Your consent will be active for this visit and any virtual visit you may have with one of our providers in the next 365 days. If you have a MyChart account, a copy of this consent can be sent to you electronically.  As this is a virtual visit, video technology does not allow for your provider to perform a traditional examination. This may limit your provider's ability to fully assess your condition. If your provider identifies any concerns that need to be evaluated in person or the need to arrange testing (such as labs, EKG, etc.), we will make arrangements to do so. Although advances in technology are sophisticated, we cannot ensure that it will always work on either your end or our end. If the connection with a video visit is poor, the visit may have to be switched to a telephone visit. With either a video or telephone visit, we are not always able to ensure that we have a secure connection.  By engaging in this virtual visit, you consent to the provision of healthcare and authorize for your insurance to be billed (if applicable) for the services provided during this visit. Depending on your insurance coverage, you may receive a charge related to this service.  I need to obtain your verbal consent now. Are you willing to proceed with your visit today? Brittney Knox has provided verbal consent on 09/21/2023 for a virtual visit (video or telephone). Brittney Lamp, FNP  Date: 09/21/2023 5:19 PM   Virtual Visit via Video Note   I, Brittney Knox, connected with  Brittney Knox  ([id number], 02/12/1980) on 09/21/23 at  5:15 PM EDT by a video-enabled telemedicine application and verified that I am speaking with the correct person using two identifiers.  Location: Patient: Virtual Visit Location Patient:  Home Provider: Virtual Visit Location Provider: Home Office   I discussed the limitations of evaluation and management by telemedicine and the availability of in person appointments. The patient expressed understanding and agreed to proceed.    History of Present Illness: Brittney Knox is a 43 y.o. who identifies as a female who was assigned female at birth, and is being seen today for head congestion for a week. Using airsupra, mucinex, dayquil, left ear pain. Left max sinus pain and pressure and left gum pain. No fever. Post nasal drainage, Prod cough. .  HPI: HPI  Problems:  Patient Active Problem List   Diagnosis Date Noted   MVP (mitral valve prolapse) 10/11/2022   Anxiety and depression 08/15/2022   Insomnia 08/15/2022   Nephrolithiasis 04/24/2021   Vitamin D  deficiency 02/23/2021   History of iron deficiency anemia 01/28/2021   Thrombocytopenia (HCC) 07/09/2020   Elevated low density lipoprotein (LDL) cholesterol level 07/09/2020   PMDD (premenstrual dysphoric disorder) 04/15/2019   H/O varicose veins of lower extremity 08/05/2017   Allergic rhinitis 12/18/2014    Allergies:  Allergies  Allergen Reactions   Morphine And Codeine  Itching   Medications:  Current Outpatient Medications:    albuterol  (VENTOLIN  HFA) 108 (90 Base) MCG/ACT inhaler, Inhale 2 puffs into the lungs every 6 (six) hours as needed for wheezing or shortness of breath., Disp: 8 g, Rfl: 0   traZODone  (DESYREL ) 50 MG tablet, Take 0.5-1 tablets (25-50 mg total) by mouth at bedtime as needed for sleep., Disp: 45 tablet, Rfl:  5  Observations/Objective: Patient is well-developed, well-nourished in no acute distress.  Resting comfortably  at home.  Head is normocephalic, atraumatic.  No labored breathing.  Speech is clear and coherent with logical content.  Patient is alert and oriented at baseline.    Assessment and Plan: 1. Acute bacterial sinusitis (Primary)  Increase fluids, continue otc meds, UC as  needed.   Follow Up Instructions: I discussed the assessment and treatment plan with the patient. The patient was provided an opportunity to ask questions and all were answered. The patient agreed with the plan and demonstrated an understanding of the instructions.  A copy of instructions were sent to the patient via MyChart unless otherwise noted below.     The patient was advised to call back or seek an in-person evaluation if the symptoms worsen or if the condition fails to improve as anticipated.    Chayah Mckee, FNP

## 2023-09-21 NOTE — Patient Instructions (Signed)

## 2023-12-08 DIAGNOSIS — L821 Other seborrheic keratosis: Secondary | ICD-10-CM | POA: Diagnosis not present

## 2023-12-25 ENCOUNTER — Ambulatory Visit
Admission: RE | Admit: 2023-12-25 | Discharge: 2023-12-25 | Disposition: A | Discharge status: Home / Self Care | Source: Ambulatory Visit | Attending: Nurse Practitioner | Admitting: Nurse Practitioner

## 2023-12-25 DIAGNOSIS — Z1231 Encounter for screening mammogram for malignant neoplasm of breast: Secondary | ICD-10-CM | POA: Insufficient documentation

## 2024-02-04 ENCOUNTER — Telehealth: Admitting: Family Medicine

## 2024-02-04 ENCOUNTER — Encounter: Payer: Self-pay | Admitting: Family Medicine

## 2024-02-04 ENCOUNTER — Telehealth: Payer: Self-pay | Admitting: Nurse Practitioner

## 2024-02-04 VITALS — Ht 65.0 in | Wt 150.0 lb

## 2024-02-04 DIAGNOSIS — F418 Other specified anxiety disorders: Secondary | ICD-10-CM

## 2024-02-04 DIAGNOSIS — F419 Anxiety disorder, unspecified: Secondary | ICD-10-CM

## 2024-02-04 MED ORDER — LORAZEPAM 0.5 MG PO TABS: ORAL_TABLET | ORAL | 0 refills | Status: AC

## 2024-02-04 NOTE — Telephone Encounter (Signed)
 Routing to provider. Is this something that can be done for the patient?

## 2024-02-04 NOTE — Telephone Encounter (Signed)
 Scheduled

## 2024-02-04 NOTE — Progress Notes (Signed)
 "  Ht 5' 5 (1.651 m)   Wt 150 lb (68 kg)   BMI 24.96 kg/m    Subjective:    Patient ID: Brittney Knox, female    DOB: April 15, 1980, 44 y.o.   MRN: [id number]  HPI: Brittney Knox is a 44 y.o. female  Chief Complaint  Patient presents with   Anxiety    Having dental procedure tomorrow would like a med to help calm nerves.   She broke a molar and is to have a dental procedure tomorrow. She's very anxious and would like something to help her get through the procedure. Took valium several years ago for a CT and that worked great. She does have someone who will drive her to the dentist. No other concerns or complaints today.  Relevant past medical, surgical, family and social history reviewed and updated as indicated. Interim medical history since our last visit reviewed. Allergies and medications reviewed and updated.  Review of Systems  Constitutional: Negative.   Respiratory: Negative.    Cardiovascular: Negative.   Psychiatric/Behavioral:  Negative for agitation, behavioral problems, confusion, decreased concentration, dysphoric mood, hallucinations, self-injury, sleep disturbance and suicidal ideas. The patient is nervous/anxious. The patient is not hyperactive.     Per HPI unless specifically indicated above     Objective:    Ht 5' 5 (1.651 m)   Wt 150 lb (68 kg)   BMI 24.96 kg/m   Wt Readings from Last 3 Encounters:  02/04/24 150 lb (68 kg)  03/06/23 158 lb 3.2 oz (71.8 kg)  01/14/23 159 lb 12.8 oz (72.5 kg)    Physical Exam Vitals and nursing note reviewed.  Constitutional:      General: She is not in acute distress.    Appearance: Normal appearance. She is not ill-appearing, toxic-appearing or diaphoretic.  HENT:     Head: Normocephalic and atraumatic.     Right Ear: External ear normal.     Left Ear: External ear normal.     Nose: Nose normal.     Mouth/Throat:     Mouth: Mucous membranes are moist.     Pharynx: Oropharynx is clear.  Eyes:     General: No  scleral icterus.       Right eye: No discharge.        Left eye: No discharge.     Conjunctiva/sclera: Conjunctivae normal.     Pupils: Pupils are equal, round, and reactive to light.  Pulmonary:     Effort: Pulmonary effort is normal. No respiratory distress.     Comments: Speaking in full sentences Musculoskeletal:        General: Normal range of motion.     Cervical back: Normal range of motion.  Skin:    Coloration: Skin is not jaundiced or pale.     Findings: No bruising, erythema, lesion or rash.  Neurological:     Mental Status: She is alert and oriented to person, place, and time. Mental status is at baseline.  Psychiatric:        Mood and Affect: Mood normal.        Behavior: Behavior normal.        Thought Content: Thought content normal.        Judgment: Judgment normal.     Results for orders placed or performed in visit on 05/26/23  Vitamin B12   Collection Time: 05/26/23 10:45 AM  Result Value Ref Range   Vitamin B-12 411 232 - 1,245 pg/mL  CBC with Differential/Platelet  Collection Time: 05/26/23 10:45 AM  Result Value Ref Range   WBC 5.2 3.4 - 10.8 x10E3/uL   RBC 4.27 3.77 - 5.28 x10E6/uL   Hemoglobin 14.4 11.1 - 15.9 g/dL   Hematocrit 56.2 65.9 - 46.6 %   MCV 102 (H) 79 - 97 fL   MCH 33.7 (H) 26.6 - 33.0 pg   MCHC 33.0 31.5 - 35.7 g/dL   RDW 88.1 88.2 - 84.5 %   Platelets 146 (L) 150 - 450 x10E3/uL   Neutrophils 64 Not Estab. %   Lymphs 25 Not Estab. %   Monocytes 7 Not Estab. %   Eos 3 Not Estab. %   Basos 1 Not Estab. %   Neutrophils Absolute 3.3 1.4 - 7.0 x10E3/uL   Lymphocytes Absolute 1.3 0.7 - 3.1 x10E3/uL   Monocytes Absolute 0.4 0.1 - 0.9 x10E3/uL   EOS (ABSOLUTE) 0.1 0.0 - 0.4 x10E3/uL   Basophils Absolute 0.1 0.0 - 0.2 x10E3/uL   Immature Granulocytes 0 Not Estab. %   Immature Grans (Abs) 0.0 0.0 - 0.1 x10E3/uL  Iron   Collection Time: 05/26/23 10:45 AM  Result Value Ref Range   Iron 111 27 - 159 ug/dL  Ferritin   Collection Time:  05/26/23 10:45 AM  Result Value Ref Range   Ferritin 153 (H) 15 - 150 ng/mL      Assessment & Plan:   Problem List Items Addressed This Visit   None Visit Diagnoses       Acute anxiety    -  Primary   Will treat with lorazepam  prior to dental procedure. Call with any concerns.        Follow up plan: Return if symptoms worsen or fail to improve.   This visit was completed via video visit through MyChart due to the restrictions of the COVID-19 pandemic. All issues as above were discussed and addressed. Physical exam was done as above through visual confirmation on video through MyChart. If it was felt that the patient should be evaluated in the office, they were directed there. The patient verbally consented to this visit. Location of the patient: home Location of the provider: work Those involved with this call:  Provider: Duwaine Louder, DO CMA: York Fogo, CMA, Front Desk/Registration: Claretta Maiden  Time spent on call: 15 minutes with patient face to face via video conference. More than 50% of this time was spent in counseling and coordination of care. 23 minutes total spent in review of patient's record and preparation of their chart.     "

## 2024-02-04 NOTE — Telephone Encounter (Signed)
 Called patient and left a message to call back to get scheduled for a virtual appt this afternoon with Dr Vicci, Will need to be transferred to [facility] for scheduling

## 2024-02-04 NOTE — Telephone Encounter (Signed)
 Copied from CRM [id number]. Topic: Clinical - Medication Question >> Feb 04, 2024 11:31 AM Wess RAMAN wrote: Reason for CRM: Patient is having oral surgery done tomorrow, 02/05/24 and would like medication called in for anxiety.  Callback #: [id number]  Pharmacy: [facility] [address], [place] - [address] [place] [address] Phone: [phone] Fax: [phone] Hours: Not open 24 hours
